# Patient Record
Sex: Male | Born: 1940 | Race: White | Hispanic: No | Marital: Married | Smoking: Never smoker
Health system: Southern US, Community
[De-identification: ages and names within clinical notes are randomized; demographics above are authoritative.]

## PROBLEM LIST (undated history)

## (undated) DIAGNOSIS — M109 Gout, unspecified: Secondary | ICD-10-CM

## (undated) DIAGNOSIS — E785 Hyperlipidemia, unspecified: Secondary | ICD-10-CM

## (undated) DIAGNOSIS — I10 Essential (primary) hypertension: Secondary | ICD-10-CM

## (undated) HISTORY — DX: Gout, unspecified: M10.9

## (undated) HISTORY — DX: Essential (primary) hypertension: I10

## (undated) HISTORY — PX: EYE SURGERY: SHX253

## (undated) HISTORY — DX: Hyperlipidemia, unspecified: E78.5

---

## 1999-08-20 ENCOUNTER — Ambulatory Visit (HOSPITAL_COMMUNITY): Admission: RE | Admit: 1999-08-20 | Discharge: 1999-08-20 | Payer: Self-pay | Admitting: Family Medicine

## 1999-08-20 ENCOUNTER — Encounter: Payer: Self-pay | Admitting: Family Medicine

## 2000-12-19 ENCOUNTER — Encounter: Admission: RE | Admit: 2000-12-19 | Discharge: 2000-12-19 | Payer: Self-pay | Admitting: Orthopedic Surgery

## 2000-12-19 ENCOUNTER — Encounter: Payer: Self-pay | Admitting: Orthopedic Surgery

## 2004-08-01 ENCOUNTER — Encounter (INDEPENDENT_AMBULATORY_CARE_PROVIDER_SITE_OTHER): Payer: Self-pay | Admitting: *Deleted

## 2004-08-01 ENCOUNTER — Ambulatory Visit (HOSPITAL_COMMUNITY): Admission: RE | Admit: 2004-08-01 | Discharge: 2004-08-01 | Payer: Self-pay | Admitting: Gastroenterology

## 2005-08-06 ENCOUNTER — Ambulatory Visit (HOSPITAL_COMMUNITY): Admission: RE | Admit: 2005-08-06 | Discharge: 2005-08-06 | Payer: Self-pay | Admitting: *Deleted

## 2006-02-05 IMAGING — US US ABDOMEN COMPLETE
1 series · 13 of 25 positions shown · non-contrast
Comparison: none

CLINICAL DATA: 64-year-old male, with abnormal LFTs. 
ABDOMEN ULTRASOUND:
TECHNIQUE: Complete abdominal ultrasound examination was performed including evaluation of the liver, gallbladder, bile ducts, pancreas, kidneys, spleen, IVC, and abdominal aorta.

[Series 1: unknown · 0.40mm/px · 13 of 86 slices shown]
[im 1/86]
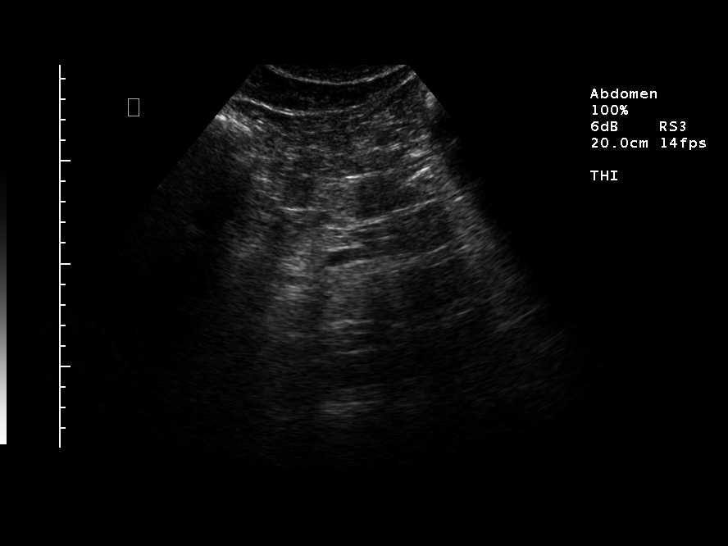
[im 8/86]
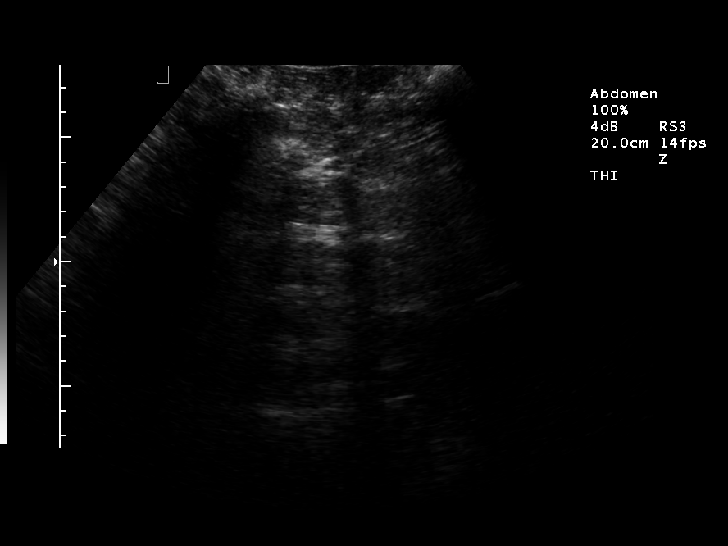
[im 15/86]
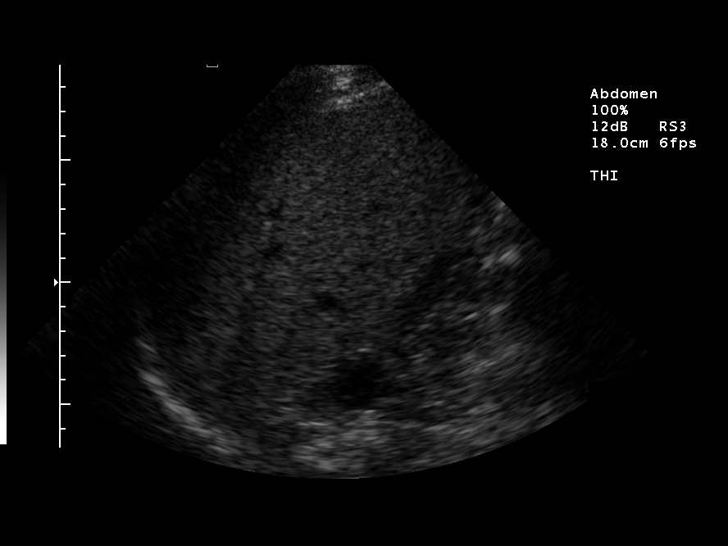
[im 22/86]
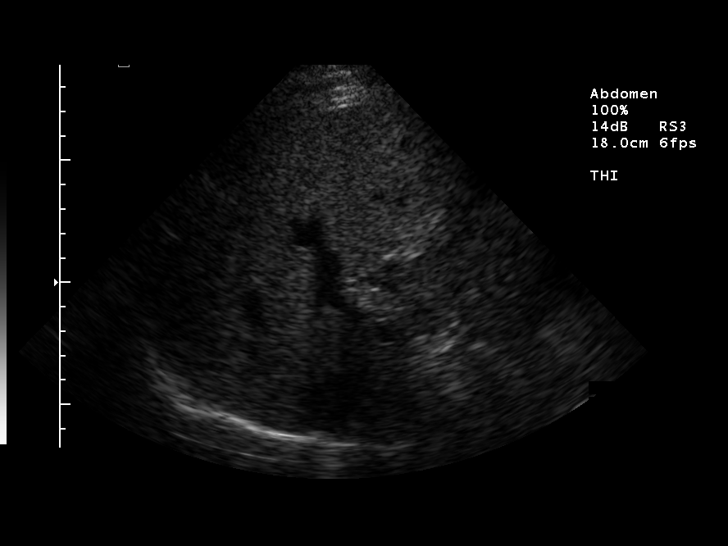
[im 29/86]
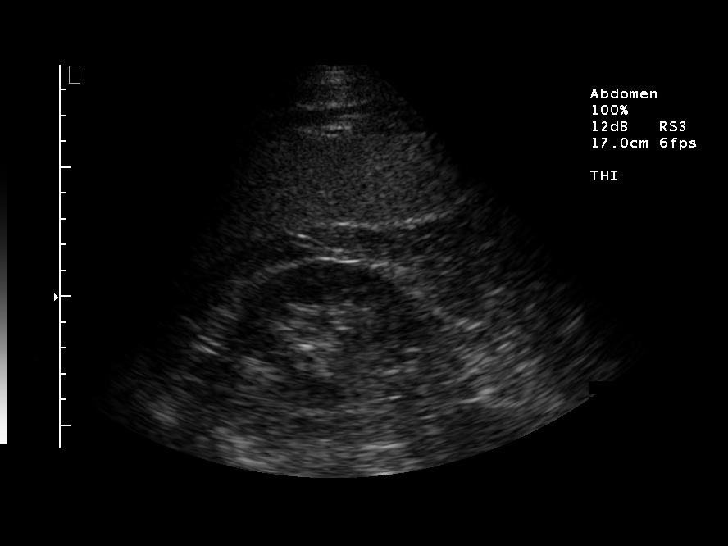
[im 36/86]
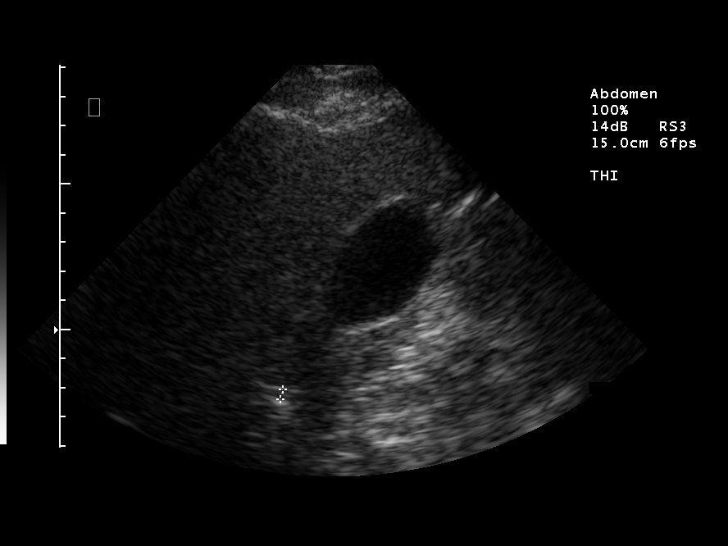
[im 43/86]
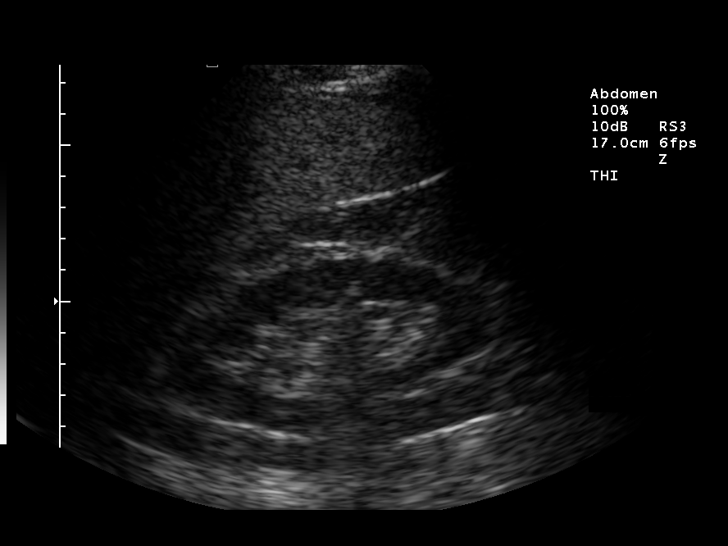
[im 50/86]
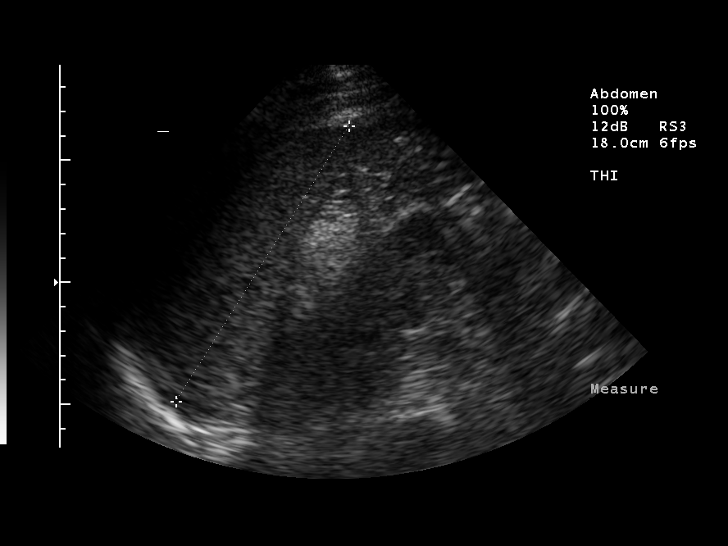
[im 57/86]
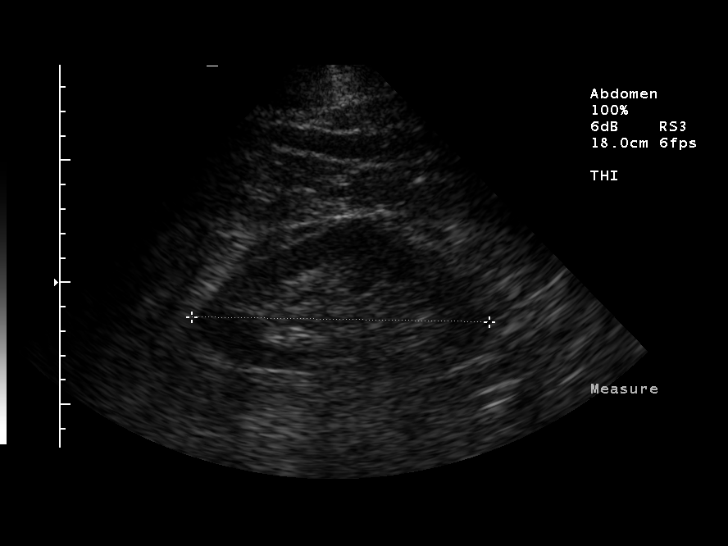
[im 64/86]
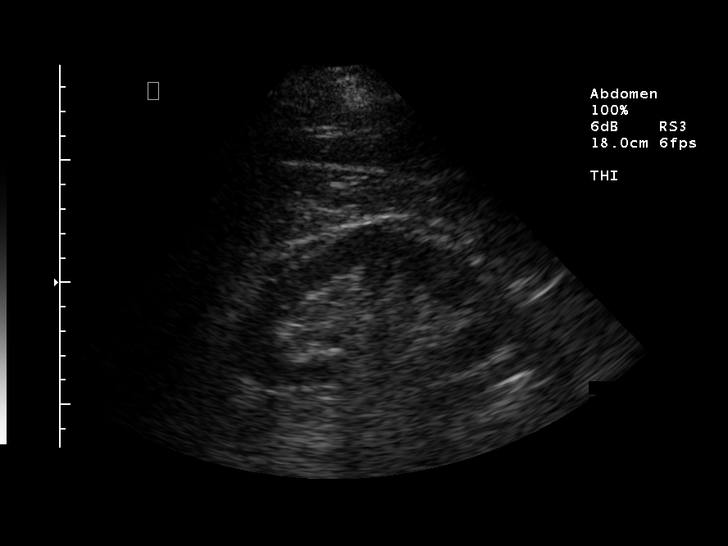
[im 71/86]
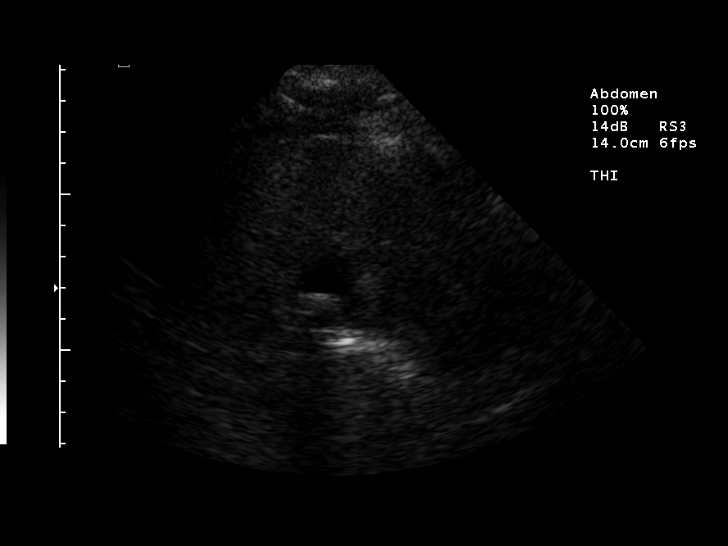
[im 78/86]
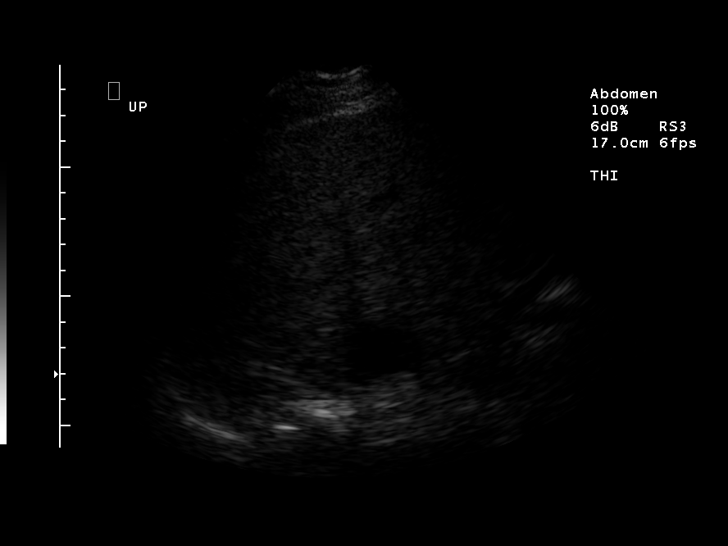
[im 86/86]
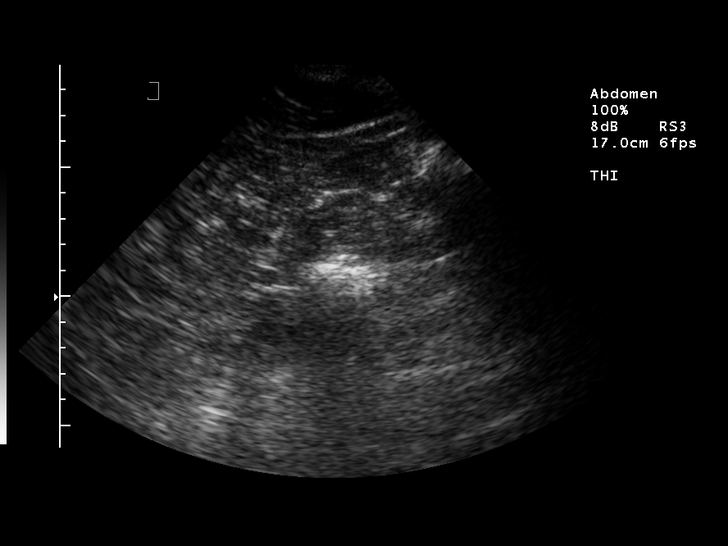

[13 of 25 positions shown; findings below may reference images not displayed]

FINDINGS: The gallbladder demonstrates multiple echogenic shadowing foci within the lumen consistent with cholelithiasis.  No gallbladder wall thickness measuring 2.8 mm.  No sonographic Murphy?s sign elicited during the exam.  The common bile duct is normal measuring 3.5 mm.  The liver demonstrates increased echogenicity consistent with fatty infiltration and mild enlargement with a length of 16.1 cm in craniocaudal dimension.  The left hepatic lobe is difficult to visualize because of bowel gas.  The IVC was not well seen.  Imaging of the pancreas was also limited because of bowel gas.  The spleen measures 13.3 cm in length.  The right kidney measures 11 cm and has an upper pole cyst measuring 2.4 x 2.0 x 3.0 cm.  Left kidney measures 12.2 cm.  Aorta was obscured by bowel gas as well.  No abdominal ascites.
IMPRESSION: 1.  Cholelithiasis without evidence of acute cholecystitis by ultrasound.  No biliary dilatation. 
2.  Diffuse fatty infiltration of the liver with mild hepatomegaly. 
3.  Limited exam with difficulty visualizing the IVC, pancreas, and aorta. 
4.  Right renal upper pole 2.4 cm x 3 cm cyst.

## 2007-08-29 ENCOUNTER — Emergency Department (HOSPITAL_COMMUNITY): Admission: EM | Admit: 2007-08-29 | Discharge: 2007-08-30 | Payer: Self-pay | Admitting: Emergency Medicine

## 2011-04-19 NOTE — Op Note (Signed)
Francisco Wells, Francisco Wells                      ACCOUNT NO.:  [id number]   MEDICAL RECORD NO.:  [id number]                   PATIENT TYPE:  AMB   LOCATION:  ENDO                                 FACILITY:  [facility]   PHYSICIAN:  Graylin Shiver, M.D.                DATE OF BIRTH:  Nov 10, 1941   DATE OF PROCEDURE:  08/01/2004  DATE OF DISCHARGE:                                 OPERATIVE REPORT   PROCEDURE:  Colonoscopy with biopsy.   ENDOSCOPIST:  Graylin Shiver, M.D.   INDICATIONS FOR PROCEDURE:  Screening.   Informed consent was obtained after explanation of the risks of bleeding,  infection and perforation.   PREMEDICATION:  Fentanyl 65 mcg IV and Versed 6.5 mg IV.   DESCRIPTION OF PROCEDURE:  With the patient in the left lateral decubitus  position a rectal exam was performed.  No masses were felt.  The Olympus  colonoscope was inserted into the rectum and advanced around the colon to  the cecum.  The cecal landmarks were identified.  The cecum and ascending  colon were normal. In the transverse colon there were four small polyps that  were biopsied off.  These were scattered along the length of the transverse  colon.  The descending colon looked normal. In the sigmoid colon there was a  small polyp biopsied off.  The rectum looked normal.  He tolerated the  procedure well without complications.   IMPRESSION:  Colon polyps (diagnosis code 211.3).   PLAN:  The pathology will be checked.                                               Graylin Shiver, M.D.    Germain Osgood  D:  08/01/2004  T:  08/01/2004  Job:  [id number]   cc:   Meredith Staggers, M.D.  [address], [address]  [place]  [place] [address]  Fax: [phone]

## 2012-01-14 DIAGNOSIS — C6932 Malignant neoplasm of left choroid: Secondary | ICD-10-CM | POA: Insufficient documentation

## 2014-05-26 ENCOUNTER — Encounter: Payer: Self-pay | Admitting: Podiatry

## 2014-05-26 ENCOUNTER — Ambulatory Visit (INDEPENDENT_AMBULATORY_CARE_PROVIDER_SITE_OTHER): Payer: Commercial Managed Care - HMO | Admitting: Podiatry

## 2014-05-26 ENCOUNTER — Ambulatory Visit (INDEPENDENT_AMBULATORY_CARE_PROVIDER_SITE_OTHER): Payer: Commercial Managed Care - HMO

## 2014-05-26 VITALS — BP 169/95 | HR 68 | Resp 16 | Ht 69.0 in | Wt 210.0 lb

## 2014-05-26 DIAGNOSIS — M779 Enthesopathy, unspecified: Secondary | ICD-10-CM

## 2014-05-26 DIAGNOSIS — M2010 Hallux valgus (acquired), unspecified foot: Secondary | ICD-10-CM

## 2014-05-26 DIAGNOSIS — M109 Gout, unspecified: Secondary | ICD-10-CM

## 2014-05-26 MED ORDER — TRIAMCINOLONE ACETONIDE 10 MG/ML IJ SUSP
10.0000 mg | Freq: Once | INTRAMUSCULAR | Status: AC
  Administered 2014-05-26: 10 mg

## 2014-05-26 NOTE — Patient Instructions (Signed)

## 2014-05-26 NOTE — Progress Notes (Signed)
   Subjective:    Patient ID: Francisco Wells, male    DOB: 1941-06-27, 73 y.o.   MRN: [id number]  HPI Comments: "They tell me its a bunion"  Patient c/o tender 1st MPJ right for several years. The area red and swollen. He gets gout flares periodically. Today, the area is not too painful. No treatment unless he has a flare.  Foot Pain      Review of Systems  All other systems reviewed and are negative.      Objective:   Physical Exam        Assessment & Plan:

## 2014-05-26 NOTE — Progress Notes (Signed)
Subjective:     Patient ID: Francisco Wells, male   DOB: 04-15-1941, 73 y.o.   MRN: [id number]  Foot Pain   patient states that he gets gout flareups approximately 2-3 times a year and that this one has been very painful in his right big toe joint. He states it moves back and forth but that it seems his attacks are less than they had been previously   Review of Systems  All other systems reviewed and are negative.      Objective:   Physical Exam  Nursing note and vitals reviewed. Constitutional: He is oriented to person, place, and time.  Cardiovascular: Intact distal pulses.   Musculoskeletal: Normal range of motion.  Neurological: He is oriented to person, place, and time.  Skin: Skin is warm.   neurovascular status intact both feet and range of motion subtalar midtarsal joint adequate. Muscle strength within normal limits and I noted there to be a prominence first metatarsal head right over left with quite a bit or redness around the first MPJ right and mild swelling around the joint surface noted. 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 We discussed gout and I gave him an instruction sheet on foods to avoid and today I did inject around the capsular and joint 3 mg Kenalog 5 mg I can Marcaine mixture and advised that if symptoms were to get worse I want to see him back and we will consider medicine to try to prevent him from having gout attacks

## 2014-06-10 ENCOUNTER — Ambulatory Visit (INDEPENDENT_AMBULATORY_CARE_PROVIDER_SITE_OTHER): Payer: Commercial Managed Care - HMO

## 2014-06-10 VITALS — BP 157/99 | HR 66 | Resp 18

## 2014-06-10 DIAGNOSIS — M2022 Hallux rigidus, left foot: Secondary | ICD-10-CM

## 2014-06-10 DIAGNOSIS — M19079 Primary osteoarthritis, unspecified ankle and foot: Secondary | ICD-10-CM

## 2014-06-10 DIAGNOSIS — M779 Enthesopathy, unspecified: Secondary | ICD-10-CM

## 2014-06-10 DIAGNOSIS — M19072 Primary osteoarthritis, left ankle and foot: Secondary | ICD-10-CM

## 2014-06-10 DIAGNOSIS — M109 Gout, unspecified: Secondary | ICD-10-CM

## 2014-06-10 DIAGNOSIS — M2020 Hallux rigidus, unspecified foot: Secondary | ICD-10-CM

## 2014-06-10 MED ORDER — COLCHICINE 0.6 MG PO TABS: ORAL_TABLET | ORAL | Status: AC

## 2014-06-10 NOTE — Patient Instructions (Signed)
Information for patients with Gout  Gout defined-Gout occurs when urate crystals accumulate in your joint causing the inflammation and intense pain of gout attack.  Urate crystals can form when you have high levels of uric acid in your blood.  Your body produces uric acid when it breaks down prurines-substances that are found naturally in your body, as well as in certain foods such as organ meats, anchioves, herring, asparagus, and mushrooms.  Normally uric acid dissolves in your blood and passes through your kidneys into your urine.  But sometimes your body either produces too much uric acid or your kidneys excrete too little uric acid.  When this happens, uric acid can build up, forming sharp needle-like urate crystals in a joint or surrounding tissue that cause pain, inflammation and swelling.    Gout is characterized by sudden, severe attacks of pain, redness and tenderness in joints, often the joint at the base of the big toe.  Gout is complex form of arthritis that can affect anyone.  Men are more likely to get gout but women become increasingly more susceptible to gout after menopause.  An acute attack of gout can wake you up in the middle of the night with the sensation that your big toe is on fire.  The affected joint is hot, swollen and so tender that even the weight or the sheet on it may seem intolerable.  If you experience symptoms of an acute gout attack it is important to your doctor as soon as the symptoms start.  Gout that goes untreated can lead to worsening pain and joint damage.  Risk Factors:  You are more likely to develop gout if you have high levels of uric acid in your body.    Factors that increase the uric acid level in your body include:  Lifestyle factors.  Excessive alcohol use-generally more than two drinks a day for men and more than one for women increase the risk of gout.  Medical conditions.  Certain conditions make it more likely that you will develop gout.   These include hypertension, and chronic conditions such as diabetes, high levels of fat and cholesterol in the blood, and narrowing of the arteries.  Certain medications.  The uses of Thiazide diuretics- commonly used to treat hypertension and low dose aspirin can also increase uric acid levels.  Family history of gout.  If other members of your family have had gout, you are more likely to develop the disease.  Age and sex. Gout occurs more often in men than it does in women, primarily because women tend to have lower uric acid levels than men do.  Men are more likely to develop gout earlier usually between the ages of 40-50- whereas women generally develop signs and symptoms after menopause.    Tests and diagnosis:  Tests to help diagnose gout may include:  Blood test.  Your doctor may recommend a blood test to measure the uric acid level in your blood .  Blood tests can be misleading, though.  Some people have high uric acid levels but never experience gout.  And some people have signs and symptoms of gout, but don't have unusual levels of uric acid in their blood.  Joint fluid test.  Your doctor may use a needle to draw fluid from your affected joint.  When examined under the microscope, your joint fluid may reveal urate crystals.  Treatment:  Treatment for gout usually involves medications.  What medications you and your doctor choose will be   based on your current health and other medications you currently take.  Gout medications can be used to treat acute gout attacks and prevent future attacks as well as reduce your risk of complications from gout such as the development of tophi from urate crystal deposits.  Alternative medicine:   Certain foods have been studied for their potential to lower uric acid levels, including:  Coffee.  Studies have found an association between coffee drinking (regular and decaf) and lower uric acid levels.  The evidence is not enough to encourage  non-coffee drinkers to start, but it may give clues to new ways of treating gout in the future.  Vitamin C.  Supplements containing vitamin C may reduce the levels of uric acid in your blood.  However, vitamin as a treatment for gout. Don't assume that if a little vitamin C is good, than lots is better.  Megadoses of vitamin C may increase your bodies uric acid levels.  Cherries.  Cherries have been associated with lower levels of uric acid in studies, but it isn't clear if they have any effect on gout signs and symptoms.  Eating more cherries and other dar-colored fruits, such as blackberries, blueberries, purple grapes and raspberries, may be a safe way to support your gout treatment.    Lifestyle/Diet Recommendations:   Drink 8 to 16 cups ( about 2 to 4 liters) of fluid each day, with at least half being water.  Avoid alcohol  Eat a moderate amount of protein, preferably from healthy sources, such as low-fat or fat-free dairy, tofu, eggs, and nut butters.  Limit you daily intake of meat, fish, and poultry to 4 to 6 ounces.  Avoid high fat meats and desserts.  Decrease you intake of shellfish, beef, lamb, pork, eggs and cheese.  Choose a good source of vitamin C daily such as citrus fruits, strawberries, broccoli,  brussel sprouts, papaya, and cantaloupe.   Choose a good source of vitamin A every other day such as yellow fruits, or dark green/yellow vegetables.  Avoid drastic weigh reduction or fasting.  If weigh loss is desired lose it over a period of several months.  See "dietary considerations.." chart for specific food recommendations.  Dietary Considerations for people with Gout  Food with negligible purine content (0-15 mg of purine nitrogen per 100 grams food)  May use as desired except on calorie variations  Non fat milk Cocoa Cereals (except in list II) Hard candies  Buttermilk Carbonated drinks Vegetables (except in list II) Sherbet  Coffee Fruits Sugar Honey  Tea  Cottage Cheese Gelatin-jell-o Salt  Fruit juice Breads Angel food Cake   Herbs/spices Jams/Jellies Carnation Instant Breakfast    Foods that do not contain excessive purine content, but must be limited due to fat content  Cream Eggs Oil and Salad Dressing  Half and Half Peanut Butter Chocolate  Whole Milk Cakes Potato Chips  Butter Ice Cream Fried Foods  Cheese Nuts Waffles, pancakes   List II: Food with moderate purine content (50-150 mg of purine nitrogen per 100 grams of food)  Limit total amount each day to 5 oz. cooked Lean meat, other than those on list III   Poultry, other than those on list III Fish, other than those on list III   Seafood, other than those on list III  These foods may be used occasionally  Peas Lentils Bran  Spinach Oatmeal Dried Beans and Peas  Asparagus Wheat Germ Mushrooms   Additional information about meat choices  Choose fish   and poultry, particularly without skin, often.  Select lean, well trimmed cuts of meat.  Avoid all fatty meats, bacon , sausage, fried meats, fried fish, or poultry, luncheon meats, cold cuts, hot dogs, meats canned or frozen in gravy, spareribs and frozen and packaged prepared meats.   List III: Foods with HIGH purine content / Foods to AVOID (150-800 mg of purine nitrogen per 100 grams of food)  Anchovies Herring Meat Broths  Liver Mackerel Meat Extracts  Kidney Scallops Meat Drippings  Sardines Wild Game Mincemeat  Sweetbreads Goose Gravy  Heart Tongue Yeast, baker's and brewers   Commercial soups made with any of the foods listed in List II or List III  In addition avoid all alcoholic beverages    Recommendations at this time for the most recent flare up of gout is to start using the colchicine or cold Gerald Stabs as instructed if no improvement within 3-4 days followup with Dr. Felisa Bonier or Dr. Blenda Mounts. Followup with her primary physician regarding possible medication for prevention of gout such as allopurinol, or Uloric.  These medicines can help reduce and maintain a reduced uric acid level. Have your primary physician check your uric acid levels.  Also to help with pain and treatment of the acute attack can take ibuprofen 200 mg tablets, 2 tablets 3 times daily as needed for pain

## 2014-06-10 NOTE — Progress Notes (Signed)
   Subjective:    Patient ID: Francisco Wells, male    DOB: 08-May-1941, 73 y.o.   MRN: [id number]  HPI  I WAS OUT TO SEE MY DAUGHTER AND Tuesday IT STARTED IN MY CALF AND I COULD NOT SLEEP AND PAINFUL AND NOW IT IS IN MY FOOT AND DR REGAL THOUGHT IT COULD BE GOUT AND WANTED TO SEE HIM AND HAVE TREATED ME FOR GOUT    Review of Systems no new systemic changes or findings     Objective:   Physical Exam 73 year old white male well-developed well-nourished right history presents at this time with a complaint of recurrence of possible gout attack. Initiates that it started in his knee he points to just inferior the knee and patellar area on his left knee he underwent to see Dr. Lorre Nick 73 orthopedist early next week. However in the knee area was not swollen and red or inflamed this most likely is just some arthropathy of his knee. Our patient has had swelling redness and inflammation of the first MTP joint left on palpation is warm to touch and is reduced range of motion and tenderness on the range of motion of the left great toe joint.  Neurovascular status is intact with pedal pulses palpable DP +2/4 bilateral PT one over 4 bilateral no pain on Compression no signs of DVT on the left side. Or refill time 3 seconds all digits neurologically epicritic and proprioceptive sensations intact and symmetric bilateral there is normal plantar response DTRs not listed orthopedic biomechanical exam reveals rigid hallux joint left with minimal range of motion and pain on any attempted range of motion however is also red swollen and inflamed consistent with the gouty arthropathy. Cannot rule it out hallux rigidus with posture arthropathy as well. Dermatologically unremarkable other than erythema first MTP joint no open wounds or ulcerations noted       Assessment & Plan:  Assessment suspect acute gout attack left first MTP joint patient is a previous steroid injection providing temporary relief in the past is  treated with colchicine however does not have any current medication to at this time new prescription for culture senior Jeanett Schlein is given to take for twice a day for the first 3 days and once days needed for pain. Also he will followup with his primary physician for evaluation of his gout and uric acid levels and may be candidate Uloric occur allopurinol treatment. Patient will followup with in one to 2 weeks there is no resolution of his symptoms with Lortab your foot first acute of his appointment with Dr. Lorre Nick for his knee and follow up on uric acid levels with his primary physician. Also may recommendations for ibuprofen 4 mg 3 times a day as needed for pain  Harriet Masson DPM

## 2014-07-13 DIAGNOSIS — Z9001 Acquired absence of eye: Secondary | ICD-10-CM | POA: Insufficient documentation

## 2014-07-13 DIAGNOSIS — Q111 Other anophthalmos: Secondary | ICD-10-CM | POA: Insufficient documentation

## 2014-09-01 ENCOUNTER — Ambulatory Visit (INDEPENDENT_AMBULATORY_CARE_PROVIDER_SITE_OTHER): Payer: Commercial Managed Care - HMO | Admitting: Podiatry

## 2014-09-01 ENCOUNTER — Ambulatory Visit (INDEPENDENT_AMBULATORY_CARE_PROVIDER_SITE_OTHER): Payer: Commercial Managed Care - HMO

## 2014-09-01 ENCOUNTER — Encounter: Payer: Self-pay | Admitting: Podiatry

## 2014-09-01 VITALS — BP 140/84 | HR 74 | Resp 15 | Ht 70.0 in | Wt 207.0 lb

## 2014-09-01 DIAGNOSIS — M10072 Idiopathic gout, left ankle and foot: Secondary | ICD-10-CM

## 2014-09-01 DIAGNOSIS — M21612 Bunion of left foot: Secondary | ICD-10-CM

## 2014-09-01 DIAGNOSIS — R609 Edema, unspecified: Secondary | ICD-10-CM

## 2014-09-01 DIAGNOSIS — M109 Gout, unspecified: Secondary | ICD-10-CM | POA: Insufficient documentation

## 2014-09-01 DIAGNOSIS — M2012 Hallux valgus (acquired), left foot: Secondary | ICD-10-CM

## 2014-09-01 MED ORDER — ALLOPURINOL 100 MG PO TABS: 100.0000 mg | ORAL_TABLET | Freq: Every day | ORAL | Status: AC

## 2014-09-01 MED ORDER — TRIAMCINOLONE ACETONIDE 10 MG/ML IJ SUSP
10.0000 mg | Freq: Once | INTRAMUSCULAR | Status: AC
  Administered 2014-09-01: 10 mg

## 2014-09-01 MED ORDER — METHYLPREDNISOLONE 4 MG PO KIT: PACK | ORAL | Status: AC

## 2014-09-01 NOTE — Progress Notes (Signed)
   Subjective:    Patient ID: Francisco Wells, male    DOB: 10/24/1941, 73 y.o.   MRN: [id number]  HPI Comments: Pt states this episode of redness, swelling and pain of the left 1st MPJ and forefoot began about 1 week ago.  Pt states he has had episodes between right and left feet for 10 years and is currently taking Colchicine.  Foot Pain      Review of Systems  All other systems reviewed and are negative.      Objective:   Physical Exam        Assessment & Plan:

## 2014-09-01 NOTE — Progress Notes (Signed)
Subjective:     Patient ID: Francisco Wells, male   DOB: Feb 17, 1941, 73 y.o.   MRN: [id number]  HPI patient points to left first MPJ stating I think this is gout and it has been really sore for about a week and I been getting about 5 or 6 attacks a year for the last couple years. Patient of Dr. 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 Today I injected around the first MPJ 3 mg Kenalog 5 mg Xylocaine and placed on for milligram Medrol Dosepak. I then placed him on temporary allopurinol to try to help prevent these attacks and we will make an appointment with Dr. Ouida Sills for better evaluation of these multiple gout attacks he is experiencing

## 2015-01-20 DIAGNOSIS — L84 Corns and callosities: Secondary | ICD-10-CM | POA: Diagnosis not present

## 2015-01-20 DIAGNOSIS — L853 Xerosis cutis: Secondary | ICD-10-CM | POA: Diagnosis not present

## 2015-01-20 DIAGNOSIS — D224 Melanocytic nevi of scalp and neck: Secondary | ICD-10-CM | POA: Diagnosis not present

## 2015-01-20 DIAGNOSIS — L821 Other seborrheic keratosis: Secondary | ICD-10-CM | POA: Diagnosis not present

## 2015-02-14 DIAGNOSIS — M1A09X1 Idiopathic chronic gout, multiple sites, with tophus (tophi): Secondary | ICD-10-CM | POA: Diagnosis not present

## 2015-03-16 DIAGNOSIS — M1A09X1 Idiopathic chronic gout, multiple sites, with tophus (tophi): Secondary | ICD-10-CM | POA: Diagnosis not present

## 2015-04-10 DIAGNOSIS — Z4422 Encounter for fitting and adjustment of artificial left eye: Secondary | ICD-10-CM | POA: Diagnosis not present

## 2015-04-24 DIAGNOSIS — Z79899 Other long term (current) drug therapy: Secondary | ICD-10-CM | POA: Diagnosis not present

## 2015-04-24 DIAGNOSIS — M1A09X1 Idiopathic chronic gout, multiple sites, with tophus (tophi): Secondary | ICD-10-CM | POA: Diagnosis not present

## 2015-06-01 DIAGNOSIS — N400 Enlarged prostate without lower urinary tract symptoms: Secondary | ICD-10-CM | POA: Diagnosis not present

## 2015-06-01 DIAGNOSIS — K219 Gastro-esophageal reflux disease without esophagitis: Secondary | ICD-10-CM | POA: Diagnosis not present

## 2015-06-01 DIAGNOSIS — N183 Chronic kidney disease, stage 3 (moderate): Secondary | ICD-10-CM | POA: Diagnosis not present

## 2015-06-01 DIAGNOSIS — Z0000 Encounter for general adult medical examination without abnormal findings: Secondary | ICD-10-CM | POA: Diagnosis not present

## 2015-06-01 DIAGNOSIS — I10 Essential (primary) hypertension: Secondary | ICD-10-CM | POA: Diagnosis not present

## 2015-06-01 DIAGNOSIS — R7309 Other abnormal glucose: Secondary | ICD-10-CM | POA: Diagnosis not present

## 2015-06-01 DIAGNOSIS — N529 Male erectile dysfunction, unspecified: Secondary | ICD-10-CM | POA: Diagnosis not present

## 2015-06-01 DIAGNOSIS — E782 Mixed hyperlipidemia: Secondary | ICD-10-CM | POA: Diagnosis not present

## 2015-07-18 DIAGNOSIS — I10 Essential (primary) hypertension: Secondary | ICD-10-CM | POA: Diagnosis not present

## 2015-07-18 DIAGNOSIS — Z9001 Acquired absence of eye: Secondary | ICD-10-CM | POA: Diagnosis not present

## 2015-07-18 DIAGNOSIS — H2511 Age-related nuclear cataract, right eye: Secondary | ICD-10-CM | POA: Diagnosis not present

## 2015-07-18 DIAGNOSIS — H10412 Chronic giant papillary conjunctivitis, left eye: Secondary | ICD-10-CM | POA: Diagnosis not present

## 2015-07-18 DIAGNOSIS — Q111 Other anophthalmos: Secondary | ICD-10-CM | POA: Diagnosis not present

## 2015-08-01 ENCOUNTER — Other Ambulatory Visit: Payer: Self-pay | Admitting: Gastroenterology

## 2015-08-01 DIAGNOSIS — Z86010 Personal history of colonic polyps: Secondary | ICD-10-CM | POA: Diagnosis not present

## 2015-08-01 DIAGNOSIS — Z09 Encounter for follow-up examination after completed treatment for conditions other than malignant neoplasm: Secondary | ICD-10-CM | POA: Diagnosis not present

## 2015-08-01 DIAGNOSIS — D122 Benign neoplasm of ascending colon: Secondary | ICD-10-CM | POA: Diagnosis not present

## 2015-08-01 DIAGNOSIS — D126 Benign neoplasm of colon, unspecified: Secondary | ICD-10-CM | POA: Diagnosis not present

## 2015-08-11 DIAGNOSIS — Z79899 Other long term (current) drug therapy: Secondary | ICD-10-CM | POA: Diagnosis not present

## 2015-08-11 DIAGNOSIS — M1A09X1 Idiopathic chronic gout, multiple sites, with tophus (tophi): Secondary | ICD-10-CM | POA: Diagnosis not present

## 2015-12-01 DIAGNOSIS — N183 Chronic kidney disease, stage 3 (moderate): Secondary | ICD-10-CM | POA: Diagnosis not present

## 2015-12-01 DIAGNOSIS — M109 Gout, unspecified: Secondary | ICD-10-CM | POA: Diagnosis not present

## 2015-12-01 DIAGNOSIS — N529 Male erectile dysfunction, unspecified: Secondary | ICD-10-CM | POA: Diagnosis not present

## 2015-12-01 DIAGNOSIS — E782 Mixed hyperlipidemia: Secondary | ICD-10-CM | POA: Diagnosis not present

## 2015-12-01 DIAGNOSIS — R7309 Other abnormal glucose: Secondary | ICD-10-CM | POA: Diagnosis not present

## 2015-12-01 DIAGNOSIS — K219 Gastro-esophageal reflux disease without esophagitis: Secondary | ICD-10-CM | POA: Diagnosis not present

## 2015-12-01 DIAGNOSIS — N400 Enlarged prostate without lower urinary tract symptoms: Secondary | ICD-10-CM | POA: Diagnosis not present

## 2015-12-01 DIAGNOSIS — I10 Essential (primary) hypertension: Secondary | ICD-10-CM | POA: Diagnosis not present

## 2016-02-08 DIAGNOSIS — M1A09X1 Idiopathic chronic gout, multiple sites, with tophus (tophi): Secondary | ICD-10-CM | POA: Diagnosis not present

## 2016-02-08 DIAGNOSIS — Z79899 Other long term (current) drug therapy: Secondary | ICD-10-CM | POA: Diagnosis not present

## 2016-05-01 DIAGNOSIS — Z4422 Encounter for fitting and adjustment of artificial left eye: Secondary | ICD-10-CM | POA: Diagnosis not present

## 2016-06-24 DIAGNOSIS — E782 Mixed hyperlipidemia: Secondary | ICD-10-CM | POA: Diagnosis not present

## 2016-06-24 DIAGNOSIS — M109 Gout, unspecified: Secondary | ICD-10-CM | POA: Diagnosis not present

## 2016-06-24 DIAGNOSIS — Z125 Encounter for screening for malignant neoplasm of prostate: Secondary | ICD-10-CM | POA: Diagnosis not present

## 2016-06-24 DIAGNOSIS — N183 Chronic kidney disease, stage 3 (moderate): Secondary | ICD-10-CM | POA: Diagnosis not present

## 2016-06-24 DIAGNOSIS — Z0000 Encounter for general adult medical examination without abnormal findings: Secondary | ICD-10-CM | POA: Diagnosis not present

## 2016-06-24 DIAGNOSIS — R7301 Impaired fasting glucose: Secondary | ICD-10-CM | POA: Diagnosis not present

## 2016-06-24 DIAGNOSIS — I10 Essential (primary) hypertension: Secondary | ICD-10-CM | POA: Diagnosis not present

## 2016-06-24 DIAGNOSIS — N400 Enlarged prostate without lower urinary tract symptoms: Secondary | ICD-10-CM | POA: Diagnosis not present

## 2016-06-24 DIAGNOSIS — N529 Male erectile dysfunction, unspecified: Secondary | ICD-10-CM | POA: Diagnosis not present

## 2016-06-24 DIAGNOSIS — K219 Gastro-esophageal reflux disease without esophagitis: Secondary | ICD-10-CM | POA: Diagnosis not present

## 2016-07-16 DIAGNOSIS — H9113 Presbycusis, bilateral: Secondary | ICD-10-CM | POA: Diagnosis not present

## 2016-07-16 DIAGNOSIS — H903 Sensorineural hearing loss, bilateral: Secondary | ICD-10-CM | POA: Diagnosis not present

## 2016-07-16 DIAGNOSIS — H9313 Tinnitus, bilateral: Secondary | ICD-10-CM | POA: Diagnosis not present

## 2016-07-18 DIAGNOSIS — H10412 Chronic giant papillary conjunctivitis, left eye: Secondary | ICD-10-CM | POA: Diagnosis not present

## 2016-07-18 DIAGNOSIS — C6932 Malignant neoplasm of left choroid: Secondary | ICD-10-CM | POA: Diagnosis not present

## 2016-07-18 DIAGNOSIS — M109 Gout, unspecified: Secondary | ICD-10-CM | POA: Diagnosis not present

## 2016-07-18 DIAGNOSIS — Z9001 Acquired absence of eye: Secondary | ICD-10-CM | POA: Diagnosis not present

## 2016-07-18 DIAGNOSIS — Q111 Other anophthalmos: Secondary | ICD-10-CM | POA: Diagnosis not present

## 2016-07-18 DIAGNOSIS — L980 Pyogenic granuloma: Secondary | ICD-10-CM | POA: Diagnosis not present

## 2016-07-18 DIAGNOSIS — H2511 Age-related nuclear cataract, right eye: Secondary | ICD-10-CM | POA: Diagnosis not present

## 2016-07-18 DIAGNOSIS — Z79899 Other long term (current) drug therapy: Secondary | ICD-10-CM | POA: Diagnosis not present

## 2016-07-18 DIAGNOSIS — Z7982 Long term (current) use of aspirin: Secondary | ICD-10-CM | POA: Diagnosis not present

## 2016-07-18 DIAGNOSIS — I10 Essential (primary) hypertension: Secondary | ICD-10-CM | POA: Diagnosis not present

## 2016-09-02 DIAGNOSIS — H903 Sensorineural hearing loss, bilateral: Secondary | ICD-10-CM | POA: Diagnosis not present

## 2017-02-07 DIAGNOSIS — Z79899 Other long term (current) drug therapy: Secondary | ICD-10-CM | POA: Diagnosis not present

## 2017-02-07 DIAGNOSIS — E669 Obesity, unspecified: Secondary | ICD-10-CM | POA: Diagnosis not present

## 2017-02-07 DIAGNOSIS — M1A09X1 Idiopathic chronic gout, multiple sites, with tophus (tophi): Secondary | ICD-10-CM | POA: Diagnosis not present

## 2017-02-07 DIAGNOSIS — Z6830 Body mass index (BMI) 30.0-30.9, adult: Secondary | ICD-10-CM | POA: Diagnosis not present

## 2017-04-14 DIAGNOSIS — Z0100 Encounter for examination of eyes and vision without abnormal findings: Secondary | ICD-10-CM | POA: Diagnosis not present

## 2017-04-21 DIAGNOSIS — Z4422 Encounter for fitting and adjustment of artificial left eye: Secondary | ICD-10-CM | POA: Diagnosis not present

## 2017-08-15 DIAGNOSIS — I10 Essential (primary) hypertension: Secondary | ICD-10-CM | POA: Diagnosis not present

## 2017-08-15 DIAGNOSIS — N400 Enlarged prostate without lower urinary tract symptoms: Secondary | ICD-10-CM | POA: Diagnosis not present

## 2017-08-15 DIAGNOSIS — Z0000 Encounter for general adult medical examination without abnormal findings: Secondary | ICD-10-CM | POA: Diagnosis not present

## 2017-08-15 DIAGNOSIS — L853 Xerosis cutis: Secondary | ICD-10-CM | POA: Diagnosis not present

## 2017-08-15 DIAGNOSIS — Z23 Encounter for immunization: Secondary | ICD-10-CM | POA: Diagnosis not present

## 2017-08-15 DIAGNOSIS — K219 Gastro-esophageal reflux disease without esophagitis: Secondary | ICD-10-CM | POA: Diagnosis not present

## 2017-08-15 DIAGNOSIS — M109 Gout, unspecified: Secondary | ICD-10-CM | POA: Diagnosis not present

## 2017-08-15 DIAGNOSIS — R7303 Prediabetes: Secondary | ICD-10-CM | POA: Diagnosis not present

## 2017-08-15 DIAGNOSIS — Z1389 Encounter for screening for other disorder: Secondary | ICD-10-CM | POA: Diagnosis not present

## 2017-08-15 DIAGNOSIS — N183 Chronic kidney disease, stage 3 (moderate): Secondary | ICD-10-CM | POA: Diagnosis not present

## 2017-08-15 DIAGNOSIS — M542 Cervicalgia: Secondary | ICD-10-CM | POA: Diagnosis not present

## 2017-08-15 DIAGNOSIS — E782 Mixed hyperlipidemia: Secondary | ICD-10-CM | POA: Diagnosis not present

## 2017-08-29 DIAGNOSIS — H02402 Unspecified ptosis of left eyelid: Secondary | ICD-10-CM | POA: Diagnosis not present

## 2017-08-29 DIAGNOSIS — L908 Other atrophic disorders of skin: Secondary | ICD-10-CM | POA: Diagnosis not present

## 2017-08-29 DIAGNOSIS — H5340 Unspecified visual field defects: Secondary | ICD-10-CM | POA: Diagnosis not present

## 2017-08-29 DIAGNOSIS — Q111 Other anophthalmos: Secondary | ICD-10-CM | POA: Diagnosis not present

## 2017-08-29 DIAGNOSIS — H05402 Unspecified enophthalmos, left eye: Secondary | ICD-10-CM | POA: Diagnosis not present

## 2017-08-29 DIAGNOSIS — H02409 Unspecified ptosis of unspecified eyelid: Secondary | ICD-10-CM | POA: Diagnosis not present

## 2017-10-27 DIAGNOSIS — Z4422 Encounter for fitting and adjustment of artificial left eye: Secondary | ICD-10-CM | POA: Diagnosis not present

## 2018-02-09 DIAGNOSIS — E669 Obesity, unspecified: Secondary | ICD-10-CM | POA: Diagnosis not present

## 2018-02-09 DIAGNOSIS — M1A09X1 Idiopathic chronic gout, multiple sites, with tophus (tophi): Secondary | ICD-10-CM | POA: Diagnosis not present

## 2018-02-09 DIAGNOSIS — M150 Primary generalized (osteo)arthritis: Secondary | ICD-10-CM | POA: Diagnosis not present

## 2018-02-09 DIAGNOSIS — Z6830 Body mass index (BMI) 30.0-30.9, adult: Secondary | ICD-10-CM | POA: Diagnosis not present

## 2018-02-09 DIAGNOSIS — Z79899 Other long term (current) drug therapy: Secondary | ICD-10-CM | POA: Diagnosis not present

## 2018-03-05 DIAGNOSIS — N529 Male erectile dysfunction, unspecified: Secondary | ICD-10-CM | POA: Diagnosis not present

## 2018-03-05 DIAGNOSIS — E782 Mixed hyperlipidemia: Secondary | ICD-10-CM | POA: Diagnosis not present

## 2018-03-05 DIAGNOSIS — K219 Gastro-esophageal reflux disease without esophagitis: Secondary | ICD-10-CM | POA: Diagnosis not present

## 2018-03-05 DIAGNOSIS — I10 Essential (primary) hypertension: Secondary | ICD-10-CM | POA: Diagnosis not present

## 2018-03-05 DIAGNOSIS — R7303 Prediabetes: Secondary | ICD-10-CM | POA: Diagnosis not present

## 2018-03-05 DIAGNOSIS — M109 Gout, unspecified: Secondary | ICD-10-CM | POA: Diagnosis not present

## 2018-08-17 DIAGNOSIS — H02402 Unspecified ptosis of left eyelid: Secondary | ICD-10-CM | POA: Diagnosis not present

## 2018-08-17 DIAGNOSIS — Q111 Other anophthalmos: Secondary | ICD-10-CM | POA: Diagnosis not present

## 2018-08-17 DIAGNOSIS — H02831 Dermatochalasis of right upper eyelid: Secondary | ICD-10-CM | POA: Diagnosis not present

## 2018-08-17 DIAGNOSIS — H02834 Dermatochalasis of left upper eyelid: Secondary | ICD-10-CM | POA: Diagnosis not present

## 2018-08-17 DIAGNOSIS — H57811 Brow ptosis, right: Secondary | ICD-10-CM | POA: Diagnosis not present

## 2018-08-17 DIAGNOSIS — H57813 Brow ptosis, bilateral: Secondary | ICD-10-CM | POA: Diagnosis not present

## 2018-08-20 DIAGNOSIS — G4700 Insomnia, unspecified: Secondary | ICD-10-CM | POA: Diagnosis not present

## 2018-08-20 DIAGNOSIS — Z1211 Encounter for screening for malignant neoplasm of colon: Secondary | ICD-10-CM | POA: Diagnosis not present

## 2018-08-20 DIAGNOSIS — M109 Gout, unspecified: Secondary | ICD-10-CM | POA: Diagnosis not present

## 2018-08-20 DIAGNOSIS — Z23 Encounter for immunization: Secondary | ICD-10-CM | POA: Diagnosis not present

## 2018-08-20 DIAGNOSIS — R7303 Prediabetes: Secondary | ICD-10-CM | POA: Diagnosis not present

## 2018-08-20 DIAGNOSIS — K219 Gastro-esophageal reflux disease without esophagitis: Secondary | ICD-10-CM | POA: Diagnosis not present

## 2018-08-20 DIAGNOSIS — Z1389 Encounter for screening for other disorder: Secondary | ICD-10-CM | POA: Diagnosis not present

## 2018-08-20 DIAGNOSIS — Z0000 Encounter for general adult medical examination without abnormal findings: Secondary | ICD-10-CM | POA: Diagnosis not present

## 2018-08-20 DIAGNOSIS — E782 Mixed hyperlipidemia: Secondary | ICD-10-CM | POA: Diagnosis not present

## 2018-08-20 DIAGNOSIS — N529 Male erectile dysfunction, unspecified: Secondary | ICD-10-CM | POA: Diagnosis not present

## 2018-08-20 DIAGNOSIS — Z125 Encounter for screening for malignant neoplasm of prostate: Secondary | ICD-10-CM | POA: Diagnosis not present

## 2018-08-20 DIAGNOSIS — I10 Essential (primary) hypertension: Secondary | ICD-10-CM | POA: Diagnosis not present

## 2018-10-02 DIAGNOSIS — Z4422 Encounter for fitting and adjustment of artificial left eye: Secondary | ICD-10-CM | POA: Diagnosis not present

## 2018-10-19 DIAGNOSIS — Q111 Other anophthalmos: Secondary | ICD-10-CM | POA: Diagnosis not present

## 2018-10-19 DIAGNOSIS — H57819 Brow ptosis, unspecified: Secondary | ICD-10-CM | POA: Diagnosis not present

## 2018-10-19 DIAGNOSIS — H02402 Unspecified ptosis of left eyelid: Secondary | ICD-10-CM | POA: Diagnosis not present

## 2018-10-19 DIAGNOSIS — H02834 Dermatochalasis of left upper eyelid: Secondary | ICD-10-CM | POA: Diagnosis not present

## 2018-10-19 DIAGNOSIS — H02831 Dermatochalasis of right upper eyelid: Secondary | ICD-10-CM | POA: Diagnosis not present

## 2019-01-11 DIAGNOSIS — I10 Essential (primary) hypertension: Secondary | ICD-10-CM | POA: Insufficient documentation

## 2019-01-25 DIAGNOSIS — Z79899 Other long term (current) drug therapy: Secondary | ICD-10-CM | POA: Diagnosis not present

## 2019-01-25 DIAGNOSIS — H02834 Dermatochalasis of left upper eyelid: Secondary | ICD-10-CM | POA: Diagnosis not present

## 2019-01-25 DIAGNOSIS — H02831 Dermatochalasis of right upper eyelid: Secondary | ICD-10-CM | POA: Diagnosis not present

## 2019-01-25 DIAGNOSIS — I10 Essential (primary) hypertension: Secondary | ICD-10-CM | POA: Diagnosis not present

## 2019-01-25 DIAGNOSIS — H57813 Brow ptosis, bilateral: Secondary | ICD-10-CM | POA: Diagnosis not present

## 2019-01-25 DIAGNOSIS — H57819 Brow ptosis, unspecified: Secondary | ICD-10-CM | POA: Diagnosis not present

## 2019-01-25 DIAGNOSIS — H02402 Unspecified ptosis of left eyelid: Secondary | ICD-10-CM | POA: Diagnosis not present

## 2019-02-02 DIAGNOSIS — H57819 Brow ptosis, unspecified: Secondary | ICD-10-CM | POA: Diagnosis not present

## 2019-02-02 DIAGNOSIS — H02402 Unspecified ptosis of left eyelid: Secondary | ICD-10-CM | POA: Diagnosis not present

## 2019-02-02 DIAGNOSIS — H02831 Dermatochalasis of right upper eyelid: Secondary | ICD-10-CM | POA: Diagnosis not present

## 2019-02-02 DIAGNOSIS — Z9889 Other specified postprocedural states: Secondary | ICD-10-CM | POA: Diagnosis not present

## 2019-02-02 DIAGNOSIS — H02834 Dermatochalasis of left upper eyelid: Secondary | ICD-10-CM | POA: Diagnosis not present

## 2019-02-10 DIAGNOSIS — M150 Primary generalized (osteo)arthritis: Secondary | ICD-10-CM | POA: Diagnosis not present

## 2019-02-10 DIAGNOSIS — E669 Obesity, unspecified: Secondary | ICD-10-CM | POA: Diagnosis not present

## 2019-02-10 DIAGNOSIS — M1A09X1 Idiopathic chronic gout, multiple sites, with tophus (tophi): Secondary | ICD-10-CM | POA: Diagnosis not present

## 2019-02-10 DIAGNOSIS — Z6830 Body mass index (BMI) 30.0-30.9, adult: Secondary | ICD-10-CM | POA: Diagnosis not present

## 2019-02-10 DIAGNOSIS — Z79899 Other long term (current) drug therapy: Secondary | ICD-10-CM | POA: Diagnosis not present

## 2019-02-19 DIAGNOSIS — I10 Essential (primary) hypertension: Secondary | ICD-10-CM | POA: Diagnosis not present

## 2019-02-19 DIAGNOSIS — N529 Male erectile dysfunction, unspecified: Secondary | ICD-10-CM | POA: Diagnosis not present

## 2019-02-19 DIAGNOSIS — E782 Mixed hyperlipidemia: Secondary | ICD-10-CM | POA: Diagnosis not present

## 2019-02-19 DIAGNOSIS — N183 Chronic kidney disease, stage 3 (moderate): Secondary | ICD-10-CM | POA: Diagnosis not present

## 2019-02-19 DIAGNOSIS — K219 Gastro-esophageal reflux disease without esophagitis: Secondary | ICD-10-CM | POA: Diagnosis not present

## 2019-02-19 DIAGNOSIS — R7303 Prediabetes: Secondary | ICD-10-CM | POA: Diagnosis not present

## 2019-02-19 DIAGNOSIS — M109 Gout, unspecified: Secondary | ICD-10-CM | POA: Diagnosis not present

## 2019-02-19 DIAGNOSIS — N400 Enlarged prostate without lower urinary tract symptoms: Secondary | ICD-10-CM | POA: Diagnosis not present

## 2019-06-08 DIAGNOSIS — Z1159 Encounter for screening for other viral diseases: Secondary | ICD-10-CM | POA: Diagnosis not present

## 2019-06-08 DIAGNOSIS — H6121 Impacted cerumen, right ear: Secondary | ICD-10-CM | POA: Diagnosis not present

## 2019-06-10 DIAGNOSIS — H6122 Impacted cerumen, left ear: Secondary | ICD-10-CM | POA: Diagnosis not present

## 2019-06-10 DIAGNOSIS — H60331 Swimmer's ear, right ear: Secondary | ICD-10-CM | POA: Diagnosis not present

## 2019-06-10 DIAGNOSIS — Z974 Presence of external hearing-aid: Secondary | ICD-10-CM | POA: Diagnosis not present

## 2019-06-10 DIAGNOSIS — H903 Sensorineural hearing loss, bilateral: Secondary | ICD-10-CM | POA: Diagnosis not present

## 2019-06-10 DIAGNOSIS — Z9089 Acquired absence of other organs: Secondary | ICD-10-CM | POA: Diagnosis not present

## 2019-06-11 DIAGNOSIS — Z9889 Other specified postprocedural states: Secondary | ICD-10-CM | POA: Diagnosis not present

## 2019-06-11 DIAGNOSIS — Z48810 Encounter for surgical aftercare following surgery on the sense organs: Secondary | ICD-10-CM | POA: Diagnosis not present

## 2019-06-18 DIAGNOSIS — H7291 Unspecified perforation of tympanic membrane, right ear: Secondary | ICD-10-CM | POA: Diagnosis not present

## 2019-06-18 DIAGNOSIS — H722X1 Other marginal perforations of tympanic membrane, right ear: Secondary | ICD-10-CM | POA: Insufficient documentation

## 2019-06-18 DIAGNOSIS — H60391 Other infective otitis externa, right ear: Secondary | ICD-10-CM | POA: Diagnosis not present

## 2019-06-21 DIAGNOSIS — Z0100 Encounter for examination of eyes and vision without abnormal findings: Secondary | ICD-10-CM | POA: Diagnosis not present

## 2019-06-21 DIAGNOSIS — H524 Presbyopia: Secondary | ICD-10-CM | POA: Diagnosis not present

## 2019-06-22 DIAGNOSIS — I10 Essential (primary) hypertension: Secondary | ICD-10-CM | POA: Diagnosis not present

## 2019-06-22 DIAGNOSIS — E782 Mixed hyperlipidemia: Secondary | ICD-10-CM | POA: Diagnosis not present

## 2019-06-22 DIAGNOSIS — N400 Enlarged prostate without lower urinary tract symptoms: Secondary | ICD-10-CM | POA: Diagnosis not present

## 2019-06-22 DIAGNOSIS — N183 Chronic kidney disease, stage 3 (moderate): Secondary | ICD-10-CM | POA: Diagnosis not present

## 2019-07-08 DIAGNOSIS — H903 Sensorineural hearing loss, bilateral: Secondary | ICD-10-CM | POA: Diagnosis not present

## 2019-07-08 DIAGNOSIS — H60391 Other infective otitis externa, right ear: Secondary | ICD-10-CM | POA: Diagnosis not present

## 2019-07-08 DIAGNOSIS — H7291 Unspecified perforation of tympanic membrane, right ear: Secondary | ICD-10-CM | POA: Diagnosis not present

## 2019-07-23 DIAGNOSIS — H90A31 Mixed conductive and sensorineural hearing loss, unilateral, right ear with restricted hearing on the contralateral side: Secondary | ICD-10-CM | POA: Diagnosis not present

## 2019-09-03 DIAGNOSIS — H903 Sensorineural hearing loss, bilateral: Secondary | ICD-10-CM | POA: Diagnosis not present

## 2019-09-03 DIAGNOSIS — H90A31 Mixed conductive and sensorineural hearing loss, unilateral, right ear with restricted hearing on the contralateral side: Secondary | ICD-10-CM | POA: Diagnosis not present

## 2019-09-03 DIAGNOSIS — H7291 Unspecified perforation of tympanic membrane, right ear: Secondary | ICD-10-CM | POA: Diagnosis not present

## 2019-09-16 DIAGNOSIS — Z0000 Encounter for general adult medical examination without abnormal findings: Secondary | ICD-10-CM | POA: Diagnosis not present

## 2019-09-16 DIAGNOSIS — M109 Gout, unspecified: Secondary | ICD-10-CM | POA: Diagnosis not present

## 2019-09-16 DIAGNOSIS — E782 Mixed hyperlipidemia: Secondary | ICD-10-CM | POA: Diagnosis not present

## 2019-09-16 DIAGNOSIS — R7303 Prediabetes: Secondary | ICD-10-CM | POA: Diagnosis not present

## 2019-09-16 DIAGNOSIS — N529 Male erectile dysfunction, unspecified: Secondary | ICD-10-CM | POA: Diagnosis not present

## 2019-09-16 DIAGNOSIS — Z1389 Encounter for screening for other disorder: Secondary | ICD-10-CM | POA: Diagnosis not present

## 2019-09-16 DIAGNOSIS — Z23 Encounter for immunization: Secondary | ICD-10-CM | POA: Diagnosis not present

## 2019-09-16 DIAGNOSIS — I10 Essential (primary) hypertension: Secondary | ICD-10-CM | POA: Diagnosis not present

## 2019-09-16 DIAGNOSIS — N1831 Chronic kidney disease, stage 3a: Secondary | ICD-10-CM | POA: Diagnosis not present

## 2019-09-28 DIAGNOSIS — Q111 Other anophthalmos: Secondary | ICD-10-CM | POA: Diagnosis not present

## 2019-09-28 DIAGNOSIS — Z9889 Other specified postprocedural states: Secondary | ICD-10-CM | POA: Diagnosis not present

## 2019-09-28 DIAGNOSIS — Z48810 Encounter for surgical aftercare following surgery on the sense organs: Secondary | ICD-10-CM | POA: Diagnosis not present

## 2019-09-28 DIAGNOSIS — H02886 Meibomian gland dysfunction of left eye, unspecified eyelid: Secondary | ICD-10-CM | POA: Diagnosis not present

## 2019-09-28 DIAGNOSIS — H02883 Meibomian gland dysfunction of right eye, unspecified eyelid: Secondary | ICD-10-CM | POA: Diagnosis not present

## 2019-11-16 DIAGNOSIS — N400 Enlarged prostate without lower urinary tract symptoms: Secondary | ICD-10-CM | POA: Diagnosis not present

## 2019-11-16 DIAGNOSIS — I10 Essential (primary) hypertension: Secondary | ICD-10-CM | POA: Diagnosis not present

## 2019-11-16 DIAGNOSIS — E782 Mixed hyperlipidemia: Secondary | ICD-10-CM | POA: Diagnosis not present

## 2019-11-16 DIAGNOSIS — N183 Chronic kidney disease, stage 3 unspecified: Secondary | ICD-10-CM | POA: Diagnosis not present

## 2019-12-15 DIAGNOSIS — I10 Essential (primary) hypertension: Secondary | ICD-10-CM | POA: Diagnosis not present

## 2019-12-15 DIAGNOSIS — N183 Chronic kidney disease, stage 3 unspecified: Secondary | ICD-10-CM | POA: Diagnosis not present

## 2019-12-15 DIAGNOSIS — N400 Enlarged prostate without lower urinary tract symptoms: Secondary | ICD-10-CM | POA: Diagnosis not present

## 2019-12-15 DIAGNOSIS — E782 Mixed hyperlipidemia: Secondary | ICD-10-CM | POA: Diagnosis not present

## 2020-01-25 DIAGNOSIS — N400 Enlarged prostate without lower urinary tract symptoms: Secondary | ICD-10-CM | POA: Diagnosis not present

## 2020-01-25 DIAGNOSIS — I10 Essential (primary) hypertension: Secondary | ICD-10-CM | POA: Diagnosis not present

## 2020-01-25 DIAGNOSIS — N183 Chronic kidney disease, stage 3 unspecified: Secondary | ICD-10-CM | POA: Diagnosis not present

## 2020-01-25 DIAGNOSIS — E782 Mixed hyperlipidemia: Secondary | ICD-10-CM | POA: Diagnosis not present

## 2020-02-10 DIAGNOSIS — I10 Essential (primary) hypertension: Secondary | ICD-10-CM | POA: Diagnosis not present

## 2020-03-17 DIAGNOSIS — R7303 Prediabetes: Secondary | ICD-10-CM | POA: Diagnosis not present

## 2020-03-17 DIAGNOSIS — N1831 Chronic kidney disease, stage 3a: Secondary | ICD-10-CM | POA: Diagnosis not present

## 2020-03-17 DIAGNOSIS — N529 Male erectile dysfunction, unspecified: Secondary | ICD-10-CM | POA: Diagnosis not present

## 2020-03-17 DIAGNOSIS — K219 Gastro-esophageal reflux disease without esophagitis: Secondary | ICD-10-CM | POA: Diagnosis not present

## 2020-03-17 DIAGNOSIS — M109 Gout, unspecified: Secondary | ICD-10-CM | POA: Diagnosis not present

## 2020-03-17 DIAGNOSIS — I10 Essential (primary) hypertension: Secondary | ICD-10-CM | POA: Diagnosis not present

## 2020-03-17 DIAGNOSIS — E782 Mixed hyperlipidemia: Secondary | ICD-10-CM | POA: Diagnosis not present

## 2020-03-31 DIAGNOSIS — H2511 Age-related nuclear cataract, right eye: Secondary | ICD-10-CM | POA: Diagnosis not present

## 2020-03-31 DIAGNOSIS — H31091 Other chorioretinal scars, right eye: Secondary | ICD-10-CM | POA: Diagnosis not present

## 2020-03-31 DIAGNOSIS — H10413 Chronic giant papillary conjunctivitis, bilateral: Secondary | ICD-10-CM | POA: Diagnosis not present

## 2020-05-19 DIAGNOSIS — Z4420 Encounter for fitting and adjustment of artificial eye, unspecified: Secondary | ICD-10-CM | POA: Diagnosis not present

## 2020-06-16 DIAGNOSIS — I10 Essential (primary) hypertension: Secondary | ICD-10-CM | POA: Diagnosis not present

## 2020-06-16 DIAGNOSIS — E782 Mixed hyperlipidemia: Secondary | ICD-10-CM | POA: Diagnosis not present

## 2020-06-16 DIAGNOSIS — N400 Enlarged prostate without lower urinary tract symptoms: Secondary | ICD-10-CM | POA: Diagnosis not present

## 2020-06-16 DIAGNOSIS — N183 Chronic kidney disease, stage 3 unspecified: Secondary | ICD-10-CM | POA: Diagnosis not present

## 2020-06-16 DIAGNOSIS — N1831 Chronic kidney disease, stage 3a: Secondary | ICD-10-CM | POA: Diagnosis not present

## 2020-07-19 DIAGNOSIS — I10 Essential (primary) hypertension: Secondary | ICD-10-CM | POA: Diagnosis not present

## 2020-07-19 DIAGNOSIS — N400 Enlarged prostate without lower urinary tract symptoms: Secondary | ICD-10-CM | POA: Diagnosis not present

## 2020-07-19 DIAGNOSIS — N183 Chronic kidney disease, stage 3 unspecified: Secondary | ICD-10-CM | POA: Diagnosis not present

## 2020-07-19 DIAGNOSIS — E782 Mixed hyperlipidemia: Secondary | ICD-10-CM | POA: Diagnosis not present

## 2020-07-19 DIAGNOSIS — N1831 Chronic kidney disease, stage 3a: Secondary | ICD-10-CM | POA: Diagnosis not present

## 2020-08-15 DIAGNOSIS — Z4422 Encounter for fitting and adjustment of artificial left eye: Secondary | ICD-10-CM | POA: Diagnosis not present

## 2020-08-18 DIAGNOSIS — E782 Mixed hyperlipidemia: Secondary | ICD-10-CM | POA: Diagnosis not present

## 2020-08-18 DIAGNOSIS — N183 Chronic kidney disease, stage 3 unspecified: Secondary | ICD-10-CM | POA: Diagnosis not present

## 2020-08-18 DIAGNOSIS — I10 Essential (primary) hypertension: Secondary | ICD-10-CM | POA: Diagnosis not present

## 2020-08-18 DIAGNOSIS — N1831 Chronic kidney disease, stage 3a: Secondary | ICD-10-CM | POA: Diagnosis not present

## 2020-08-18 DIAGNOSIS — N400 Enlarged prostate without lower urinary tract symptoms: Secondary | ICD-10-CM | POA: Diagnosis not present

## 2020-10-02 DIAGNOSIS — I10 Essential (primary) hypertension: Secondary | ICD-10-CM | POA: Diagnosis not present

## 2020-10-02 DIAGNOSIS — E782 Mixed hyperlipidemia: Secondary | ICD-10-CM | POA: Diagnosis not present

## 2020-10-02 DIAGNOSIS — N529 Male erectile dysfunction, unspecified: Secondary | ICD-10-CM | POA: Diagnosis not present

## 2020-10-02 DIAGNOSIS — R7303 Prediabetes: Secondary | ICD-10-CM | POA: Diagnosis not present

## 2020-10-02 DIAGNOSIS — M109 Gout, unspecified: Secondary | ICD-10-CM | POA: Diagnosis not present

## 2020-10-02 DIAGNOSIS — Z0000 Encounter for general adult medical examination without abnormal findings: Secondary | ICD-10-CM | POA: Diagnosis not present

## 2020-10-02 DIAGNOSIS — N1831 Chronic kidney disease, stage 3a: Secondary | ICD-10-CM | POA: Diagnosis not present

## 2020-10-02 DIAGNOSIS — Z23 Encounter for immunization: Secondary | ICD-10-CM | POA: Diagnosis not present

## 2020-10-02 DIAGNOSIS — K219 Gastro-esophageal reflux disease without esophagitis: Secondary | ICD-10-CM | POA: Diagnosis not present

## 2020-10-02 DIAGNOSIS — Z1211 Encounter for screening for malignant neoplasm of colon: Secondary | ICD-10-CM | POA: Diagnosis not present

## 2020-10-02 DIAGNOSIS — Z125 Encounter for screening for malignant neoplasm of prostate: Secondary | ICD-10-CM | POA: Diagnosis not present

## 2020-10-02 DIAGNOSIS — Z1389 Encounter for screening for other disorder: Secondary | ICD-10-CM | POA: Diagnosis not present

## 2020-11-27 DIAGNOSIS — N183 Chronic kidney disease, stage 3 unspecified: Secondary | ICD-10-CM | POA: Diagnosis not present

## 2020-11-27 DIAGNOSIS — E782 Mixed hyperlipidemia: Secondary | ICD-10-CM | POA: Diagnosis not present

## 2020-11-27 DIAGNOSIS — I10 Essential (primary) hypertension: Secondary | ICD-10-CM | POA: Diagnosis not present

## 2020-11-27 DIAGNOSIS — K219 Gastro-esophageal reflux disease without esophagitis: Secondary | ICD-10-CM | POA: Diagnosis not present

## 2020-11-27 DIAGNOSIS — G4700 Insomnia, unspecified: Secondary | ICD-10-CM | POA: Diagnosis not present

## 2020-11-27 DIAGNOSIS — N1831 Chronic kidney disease, stage 3a: Secondary | ICD-10-CM | POA: Diagnosis not present

## 2020-11-27 DIAGNOSIS — N400 Enlarged prostate without lower urinary tract symptoms: Secondary | ICD-10-CM | POA: Diagnosis not present

## 2021-02-09 DIAGNOSIS — Z4422 Encounter for fitting and adjustment of artificial left eye: Secondary | ICD-10-CM | POA: Diagnosis not present

## 2021-04-03 DIAGNOSIS — I10 Essential (primary) hypertension: Secondary | ICD-10-CM | POA: Diagnosis not present

## 2021-04-03 DIAGNOSIS — K219 Gastro-esophageal reflux disease without esophagitis: Secondary | ICD-10-CM | POA: Diagnosis not present

## 2021-04-03 DIAGNOSIS — N529 Male erectile dysfunction, unspecified: Secondary | ICD-10-CM | POA: Diagnosis not present

## 2021-04-03 DIAGNOSIS — R7303 Prediabetes: Secondary | ICD-10-CM | POA: Diagnosis not present

## 2021-04-03 DIAGNOSIS — M109 Gout, unspecified: Secondary | ICD-10-CM | POA: Diagnosis not present

## 2021-04-03 DIAGNOSIS — E782 Mixed hyperlipidemia: Secondary | ICD-10-CM | POA: Diagnosis not present

## 2021-04-03 DIAGNOSIS — N1831 Chronic kidney disease, stage 3a: Secondary | ICD-10-CM | POA: Diagnosis not present

## 2021-05-08 DIAGNOSIS — H31091 Other chorioretinal scars, right eye: Secondary | ICD-10-CM | POA: Diagnosis not present

## 2021-05-08 DIAGNOSIS — H2511 Age-related nuclear cataract, right eye: Secondary | ICD-10-CM | POA: Diagnosis not present

## 2021-05-08 DIAGNOSIS — H10413 Chronic giant papillary conjunctivitis, bilateral: Secondary | ICD-10-CM | POA: Diagnosis not present

## 2021-05-29 DIAGNOSIS — H02125 Mechanical ectropion of left lower eyelid: Secondary | ICD-10-CM | POA: Diagnosis not present

## 2021-05-29 DIAGNOSIS — H10412 Chronic giant papillary conjunctivitis, left eye: Secondary | ICD-10-CM | POA: Diagnosis not present

## 2021-05-29 DIAGNOSIS — H57813 Brow ptosis, bilateral: Secondary | ICD-10-CM | POA: Diagnosis not present

## 2021-05-29 DIAGNOSIS — Q111 Other anophthalmos: Secondary | ICD-10-CM | POA: Diagnosis not present

## 2021-05-29 DIAGNOSIS — H05222 Edema of left orbit: Secondary | ICD-10-CM | POA: Diagnosis not present

## 2021-06-05 DIAGNOSIS — H10412 Chronic giant papillary conjunctivitis, left eye: Secondary | ICD-10-CM | POA: Diagnosis not present

## 2021-06-05 DIAGNOSIS — H05222 Edema of left orbit: Secondary | ICD-10-CM | POA: Diagnosis not present

## 2021-06-05 DIAGNOSIS — Q111 Other anophthalmos: Secondary | ICD-10-CM | POA: Diagnosis not present

## 2021-06-05 DIAGNOSIS — H57813 Brow ptosis, bilateral: Secondary | ICD-10-CM | POA: Diagnosis not present

## 2021-06-05 DIAGNOSIS — H02125 Mechanical ectropion of left lower eyelid: Secondary | ICD-10-CM | POA: Diagnosis not present

## 2021-09-04 DIAGNOSIS — H10412 Chronic giant papillary conjunctivitis, left eye: Secondary | ICD-10-CM | POA: Diagnosis not present

## 2021-09-04 DIAGNOSIS — H05332 Deformity of left orbit due to trauma or surgery: Secondary | ICD-10-CM | POA: Diagnosis not present

## 2021-09-04 DIAGNOSIS — H0552 Retained (old) foreign body following penetrating wound of left orbit: Secondary | ICD-10-CM | POA: Diagnosis not present

## 2021-09-04 DIAGNOSIS — Q111 Other anophthalmos: Secondary | ICD-10-CM | POA: Diagnosis not present

## 2021-09-04 DIAGNOSIS — H05222 Edema of left orbit: Secondary | ICD-10-CM | POA: Diagnosis not present

## 2021-09-04 DIAGNOSIS — H02125 Mechanical ectropion of left lower eyelid: Secondary | ICD-10-CM | POA: Diagnosis not present

## 2021-09-04 DIAGNOSIS — H11242 Scarring of conjunctiva, left eye: Secondary | ICD-10-CM | POA: Diagnosis not present

## 2021-09-04 DIAGNOSIS — H57813 Brow ptosis, bilateral: Secondary | ICD-10-CM | POA: Diagnosis not present

## 2021-09-14 DIAGNOSIS — Z4422 Encounter for fitting and adjustment of artificial left eye: Secondary | ICD-10-CM | POA: Diagnosis not present

## 2021-09-28 DIAGNOSIS — Z4422 Encounter for fitting and adjustment of artificial left eye: Secondary | ICD-10-CM | POA: Diagnosis not present

## 2021-11-05 DIAGNOSIS — M109 Gout, unspecified: Secondary | ICD-10-CM | POA: Diagnosis not present

## 2021-11-05 DIAGNOSIS — K219 Gastro-esophageal reflux disease without esophagitis: Secondary | ICD-10-CM | POA: Diagnosis not present

## 2021-11-05 DIAGNOSIS — I10 Essential (primary) hypertension: Secondary | ICD-10-CM | POA: Diagnosis not present

## 2021-11-05 DIAGNOSIS — Z1211 Encounter for screening for malignant neoplasm of colon: Secondary | ICD-10-CM | POA: Diagnosis not present

## 2021-11-05 DIAGNOSIS — Z0000 Encounter for general adult medical examination without abnormal findings: Secondary | ICD-10-CM | POA: Diagnosis not present

## 2021-11-05 DIAGNOSIS — R7303 Prediabetes: Secondary | ICD-10-CM | POA: Diagnosis not present

## 2021-11-05 DIAGNOSIS — E782 Mixed hyperlipidemia: Secondary | ICD-10-CM | POA: Diagnosis not present

## 2021-11-05 DIAGNOSIS — N1831 Chronic kidney disease, stage 3a: Secondary | ICD-10-CM | POA: Diagnosis not present

## 2021-11-05 DIAGNOSIS — N529 Male erectile dysfunction, unspecified: Secondary | ICD-10-CM | POA: Diagnosis not present

## 2021-11-06 DIAGNOSIS — H90A22 Sensorineural hearing loss, unilateral, left ear, with restricted hearing on the contralateral side: Secondary | ICD-10-CM | POA: Diagnosis not present

## 2021-11-06 DIAGNOSIS — H90A31 Mixed conductive and sensorineural hearing loss, unilateral, right ear with restricted hearing on the contralateral side: Secondary | ICD-10-CM | POA: Diagnosis not present

## 2021-11-09 DIAGNOSIS — H6123 Impacted cerumen, bilateral: Secondary | ICD-10-CM | POA: Diagnosis not present

## 2021-11-09 DIAGNOSIS — H90A31 Mixed conductive and sensorineural hearing loss, unilateral, right ear with restricted hearing on the contralateral side: Secondary | ICD-10-CM | POA: Insufficient documentation

## 2021-11-09 DIAGNOSIS — H7111 Cholesteatoma of tympanum, right ear: Secondary | ICD-10-CM | POA: Insufficient documentation

## 2021-12-11 DIAGNOSIS — H16212 Exposure keratoconjunctivitis, left eye: Secondary | ICD-10-CM | POA: Diagnosis not present

## 2021-12-11 DIAGNOSIS — H04562 Stenosis of left lacrimal punctum: Secondary | ICD-10-CM | POA: Diagnosis not present

## 2021-12-11 DIAGNOSIS — H11822 Conjunctivochalasis, left eye: Secondary | ICD-10-CM | POA: Diagnosis not present

## 2021-12-11 DIAGNOSIS — H05332 Deformity of left orbit due to trauma or surgery: Secondary | ICD-10-CM | POA: Diagnosis not present

## 2021-12-11 DIAGNOSIS — H11242 Scarring of conjunctiva, left eye: Secondary | ICD-10-CM | POA: Diagnosis not present

## 2021-12-11 DIAGNOSIS — H02535 Eyelid retraction left lower eyelid: Secondary | ICD-10-CM | POA: Diagnosis not present

## 2021-12-11 DIAGNOSIS — H02115 Cicatricial ectropion of left lower eyelid: Secondary | ICD-10-CM | POA: Diagnosis not present

## 2021-12-11 DIAGNOSIS — H02135 Senile ectropion of left lower eyelid: Secondary | ICD-10-CM | POA: Diagnosis not present

## 2022-02-04 DIAGNOSIS — H11242 Scarring of conjunctiva, left eye: Secondary | ICD-10-CM | POA: Diagnosis not present

## 2022-02-04 DIAGNOSIS — Z09 Encounter for follow-up examination after completed treatment for conditions other than malignant neoplasm: Secondary | ICD-10-CM | POA: Diagnosis not present

## 2022-02-04 DIAGNOSIS — H05332 Deformity of left orbit due to trauma or surgery: Secondary | ICD-10-CM | POA: Diagnosis not present

## 2022-02-04 DIAGNOSIS — H02413 Mechanical ptosis of bilateral eyelids: Secondary | ICD-10-CM | POA: Diagnosis not present

## 2022-02-04 DIAGNOSIS — H02423 Myogenic ptosis of bilateral eyelids: Secondary | ICD-10-CM | POA: Diagnosis not present

## 2022-02-19 DIAGNOSIS — H53481 Generalized contraction of visual field, right eye: Secondary | ICD-10-CM | POA: Diagnosis not present

## 2022-04-11 DIAGNOSIS — M1711 Unilateral primary osteoarthritis, right knee: Secondary | ICD-10-CM | POA: Diagnosis not present

## 2022-05-07 DIAGNOSIS — H02423 Myogenic ptosis of bilateral eyelids: Secondary | ICD-10-CM | POA: Diagnosis not present

## 2022-05-07 DIAGNOSIS — H57813 Brow ptosis, bilateral: Secondary | ICD-10-CM | POA: Diagnosis not present

## 2022-05-07 DIAGNOSIS — H53453 Other localized visual field defect, bilateral: Secondary | ICD-10-CM | POA: Diagnosis not present

## 2022-05-07 DIAGNOSIS — Q111 Other anophthalmos: Secondary | ICD-10-CM | POA: Diagnosis not present

## 2022-05-07 DIAGNOSIS — H02834 Dermatochalasis of left upper eyelid: Secondary | ICD-10-CM | POA: Diagnosis not present

## 2022-05-07 DIAGNOSIS — H02413 Mechanical ptosis of bilateral eyelids: Secondary | ICD-10-CM | POA: Diagnosis not present

## 2022-05-07 DIAGNOSIS — H02831 Dermatochalasis of right upper eyelid: Secondary | ICD-10-CM | POA: Diagnosis not present

## 2022-05-07 DIAGNOSIS — H11242 Scarring of conjunctiva, left eye: Secondary | ICD-10-CM | POA: Diagnosis not present

## 2022-05-07 DIAGNOSIS — H05332 Deformity of left orbit due to trauma or surgery: Secondary | ICD-10-CM | POA: Diagnosis not present

## 2022-09-04 DIAGNOSIS — M1711 Unilateral primary osteoarthritis, right knee: Secondary | ICD-10-CM | POA: Diagnosis not present

## 2022-09-16 DIAGNOSIS — N529 Male erectile dysfunction, unspecified: Secondary | ICD-10-CM | POA: Diagnosis not present

## 2022-09-16 DIAGNOSIS — R7303 Prediabetes: Secondary | ICD-10-CM | POA: Diagnosis not present

## 2022-09-16 DIAGNOSIS — I10 Essential (primary) hypertension: Secondary | ICD-10-CM | POA: Diagnosis not present

## 2022-09-16 DIAGNOSIS — E782 Mixed hyperlipidemia: Secondary | ICD-10-CM | POA: Diagnosis not present

## 2022-09-16 DIAGNOSIS — M109 Gout, unspecified: Secondary | ICD-10-CM | POA: Diagnosis not present

## 2022-09-16 DIAGNOSIS — N1831 Chronic kidney disease, stage 3a: Secondary | ICD-10-CM | POA: Diagnosis not present

## 2022-09-16 DIAGNOSIS — K219 Gastro-esophageal reflux disease without esophagitis: Secondary | ICD-10-CM | POA: Diagnosis not present

## 2022-09-16 DIAGNOSIS — Z23 Encounter for immunization: Secondary | ICD-10-CM | POA: Diagnosis not present

## 2022-10-10 DIAGNOSIS — M1711 Unilateral primary osteoarthritis, right knee: Secondary | ICD-10-CM | POA: Diagnosis not present

## 2022-11-14 DIAGNOSIS — Q111 Other anophthalmos: Secondary | ICD-10-CM | POA: Diagnosis not present

## 2022-11-14 DIAGNOSIS — H11242 Scarring of conjunctiva, left eye: Secondary | ICD-10-CM | POA: Diagnosis not present

## 2022-12-20 DIAGNOSIS — E782 Mixed hyperlipidemia: Secondary | ICD-10-CM | POA: Diagnosis not present

## 2022-12-20 DIAGNOSIS — Z0000 Encounter for general adult medical examination without abnormal findings: Secondary | ICD-10-CM | POA: Diagnosis not present

## 2022-12-20 DIAGNOSIS — N1831 Chronic kidney disease, stage 3a: Secondary | ICD-10-CM | POA: Diagnosis not present

## 2022-12-20 DIAGNOSIS — N529 Male erectile dysfunction, unspecified: Secondary | ICD-10-CM | POA: Diagnosis not present

## 2022-12-20 DIAGNOSIS — I10 Essential (primary) hypertension: Secondary | ICD-10-CM | POA: Diagnosis not present

## 2022-12-20 DIAGNOSIS — R7303 Prediabetes: Secondary | ICD-10-CM | POA: Diagnosis not present

## 2022-12-20 DIAGNOSIS — M1711 Unilateral primary osteoarthritis, right knee: Secondary | ICD-10-CM | POA: Diagnosis not present

## 2022-12-20 DIAGNOSIS — M109 Gout, unspecified: Secondary | ICD-10-CM | POA: Diagnosis not present

## 2022-12-20 DIAGNOSIS — K219 Gastro-esophageal reflux disease without esophagitis: Secondary | ICD-10-CM | POA: Diagnosis not present

## 2022-12-20 DIAGNOSIS — Z1331 Encounter for screening for depression: Secondary | ICD-10-CM | POA: Diagnosis not present

## 2022-12-26 DIAGNOSIS — Z1211 Encounter for screening for malignant neoplasm of colon: Secondary | ICD-10-CM | POA: Diagnosis not present

## 2022-12-26 DIAGNOSIS — Z23 Encounter for immunization: Secondary | ICD-10-CM | POA: Diagnosis not present

## 2023-02-11 DIAGNOSIS — M25561 Pain in right knee: Secondary | ICD-10-CM | POA: Diagnosis not present

## 2023-02-11 DIAGNOSIS — G8929 Other chronic pain: Secondary | ICD-10-CM | POA: Diagnosis not present

## 2023-02-11 DIAGNOSIS — I10 Essential (primary) hypertension: Secondary | ICD-10-CM | POA: Diagnosis not present

## 2023-02-21 DIAGNOSIS — M25561 Pain in right knee: Secondary | ICD-10-CM | POA: Diagnosis not present

## 2023-02-21 DIAGNOSIS — G8929 Other chronic pain: Secondary | ICD-10-CM | POA: Diagnosis not present

## 2023-02-26 DIAGNOSIS — G8918 Other acute postprocedural pain: Secondary | ICD-10-CM | POA: Diagnosis not present

## 2023-02-26 DIAGNOSIS — M1711 Unilateral primary osteoarthritis, right knee: Secondary | ICD-10-CM | POA: Diagnosis not present

## 2023-03-06 DIAGNOSIS — M25561 Pain in right knee: Secondary | ICD-10-CM | POA: Diagnosis not present

## 2023-03-06 DIAGNOSIS — M25461 Effusion, right knee: Secondary | ICD-10-CM | POA: Diagnosis not present

## 2023-03-06 DIAGNOSIS — Z96651 Presence of right artificial knee joint: Secondary | ICD-10-CM | POA: Diagnosis not present

## 2023-03-06 DIAGNOSIS — M25661 Stiffness of right knee, not elsewhere classified: Secondary | ICD-10-CM | POA: Diagnosis not present

## 2023-03-06 DIAGNOSIS — R29898 Other symptoms and signs involving the musculoskeletal system: Secondary | ICD-10-CM | POA: Diagnosis not present

## 2023-03-06 DIAGNOSIS — Z7409 Other reduced mobility: Secondary | ICD-10-CM | POA: Diagnosis not present

## 2023-03-06 DIAGNOSIS — G8929 Other chronic pain: Secondary | ICD-10-CM | POA: Diagnosis not present

## 2023-03-11 DIAGNOSIS — R29898 Other symptoms and signs involving the musculoskeletal system: Secondary | ICD-10-CM | POA: Diagnosis not present

## 2023-03-11 DIAGNOSIS — M25461 Effusion, right knee: Secondary | ICD-10-CM | POA: Diagnosis not present

## 2023-03-11 DIAGNOSIS — M25661 Stiffness of right knee, not elsewhere classified: Secondary | ICD-10-CM | POA: Diagnosis not present

## 2023-03-11 DIAGNOSIS — Z96651 Presence of right artificial knee joint: Secondary | ICD-10-CM | POA: Diagnosis not present

## 2023-03-11 DIAGNOSIS — M25561 Pain in right knee: Secondary | ICD-10-CM | POA: Diagnosis not present

## 2023-03-13 DIAGNOSIS — Z96651 Presence of right artificial knee joint: Secondary | ICD-10-CM | POA: Diagnosis not present

## 2023-03-13 DIAGNOSIS — R29898 Other symptoms and signs involving the musculoskeletal system: Secondary | ICD-10-CM | POA: Diagnosis not present

## 2023-03-13 DIAGNOSIS — M25461 Effusion, right knee: Secondary | ICD-10-CM | POA: Diagnosis not present

## 2023-03-13 DIAGNOSIS — M25661 Stiffness of right knee, not elsewhere classified: Secondary | ICD-10-CM | POA: Diagnosis not present

## 2023-03-13 DIAGNOSIS — M25561 Pain in right knee: Secondary | ICD-10-CM | POA: Diagnosis not present

## 2023-03-18 DIAGNOSIS — M25661 Stiffness of right knee, not elsewhere classified: Secondary | ICD-10-CM | POA: Diagnosis not present

## 2023-03-18 DIAGNOSIS — R29898 Other symptoms and signs involving the musculoskeletal system: Secondary | ICD-10-CM | POA: Diagnosis not present

## 2023-03-18 DIAGNOSIS — M25461 Effusion, right knee: Secondary | ICD-10-CM | POA: Diagnosis not present

## 2023-03-18 DIAGNOSIS — Z96651 Presence of right artificial knee joint: Secondary | ICD-10-CM | POA: Diagnosis not present

## 2023-03-18 DIAGNOSIS — M25561 Pain in right knee: Secondary | ICD-10-CM | POA: Diagnosis not present

## 2023-03-19 DIAGNOSIS — M25661 Stiffness of right knee, not elsewhere classified: Secondary | ICD-10-CM | POA: Diagnosis not present

## 2023-03-19 DIAGNOSIS — R29898 Other symptoms and signs involving the musculoskeletal system: Secondary | ICD-10-CM | POA: Diagnosis not present

## 2023-03-19 DIAGNOSIS — M25561 Pain in right knee: Secondary | ICD-10-CM | POA: Diagnosis not present

## 2023-03-19 DIAGNOSIS — M25461 Effusion, right knee: Secondary | ICD-10-CM | POA: Diagnosis not present

## 2023-03-19 DIAGNOSIS — Z96651 Presence of right artificial knee joint: Secondary | ICD-10-CM | POA: Diagnosis not present

## 2023-03-25 DIAGNOSIS — M25661 Stiffness of right knee, not elsewhere classified: Secondary | ICD-10-CM | POA: Diagnosis not present

## 2023-03-25 DIAGNOSIS — R29898 Other symptoms and signs involving the musculoskeletal system: Secondary | ICD-10-CM | POA: Diagnosis not present

## 2023-03-25 DIAGNOSIS — Z7409 Other reduced mobility: Secondary | ICD-10-CM | POA: Diagnosis not present

## 2023-03-25 DIAGNOSIS — M25461 Effusion, right knee: Secondary | ICD-10-CM | POA: Diagnosis not present

## 2023-03-25 DIAGNOSIS — Z96651 Presence of right artificial knee joint: Secondary | ICD-10-CM | POA: Diagnosis not present

## 2023-03-25 DIAGNOSIS — M25561 Pain in right knee: Secondary | ICD-10-CM | POA: Diagnosis not present

## 2023-03-27 DIAGNOSIS — M25561 Pain in right knee: Secondary | ICD-10-CM | POA: Diagnosis not present

## 2023-03-27 DIAGNOSIS — Z7409 Other reduced mobility: Secondary | ICD-10-CM | POA: Diagnosis not present

## 2023-03-27 DIAGNOSIS — Z96651 Presence of right artificial knee joint: Secondary | ICD-10-CM | POA: Diagnosis not present

## 2023-03-27 DIAGNOSIS — R29898 Other symptoms and signs involving the musculoskeletal system: Secondary | ICD-10-CM | POA: Diagnosis not present

## 2023-03-27 DIAGNOSIS — M25461 Effusion, right knee: Secondary | ICD-10-CM | POA: Diagnosis not present

## 2023-03-27 DIAGNOSIS — M25661 Stiffness of right knee, not elsewhere classified: Secondary | ICD-10-CM | POA: Diagnosis not present

## 2023-04-01 DIAGNOSIS — R29898 Other symptoms and signs involving the musculoskeletal system: Secondary | ICD-10-CM | POA: Diagnosis not present

## 2023-04-01 DIAGNOSIS — Z7409 Other reduced mobility: Secondary | ICD-10-CM | POA: Diagnosis not present

## 2023-04-01 DIAGNOSIS — M25661 Stiffness of right knee, not elsewhere classified: Secondary | ICD-10-CM | POA: Diagnosis not present

## 2023-04-01 DIAGNOSIS — M25461 Effusion, right knee: Secondary | ICD-10-CM | POA: Diagnosis not present

## 2023-04-01 DIAGNOSIS — M25561 Pain in right knee: Secondary | ICD-10-CM | POA: Diagnosis not present

## 2023-04-01 DIAGNOSIS — Z96651 Presence of right artificial knee joint: Secondary | ICD-10-CM | POA: Diagnosis not present

## 2023-04-04 DIAGNOSIS — M25661 Stiffness of right knee, not elsewhere classified: Secondary | ICD-10-CM | POA: Diagnosis not present

## 2023-04-04 DIAGNOSIS — Z7409 Other reduced mobility: Secondary | ICD-10-CM | POA: Diagnosis not present

## 2023-04-04 DIAGNOSIS — R29898 Other symptoms and signs involving the musculoskeletal system: Secondary | ICD-10-CM | POA: Diagnosis not present

## 2023-04-04 DIAGNOSIS — M25561 Pain in right knee: Secondary | ICD-10-CM | POA: Diagnosis not present

## 2023-04-04 DIAGNOSIS — M25461 Effusion, right knee: Secondary | ICD-10-CM | POA: Diagnosis not present

## 2023-04-04 DIAGNOSIS — Z96651 Presence of right artificial knee joint: Secondary | ICD-10-CM | POA: Diagnosis not present

## 2023-04-08 DIAGNOSIS — M25461 Effusion, right knee: Secondary | ICD-10-CM | POA: Diagnosis not present

## 2023-04-08 DIAGNOSIS — G8929 Other chronic pain: Secondary | ICD-10-CM | POA: Diagnosis not present

## 2023-04-08 DIAGNOSIS — M25661 Stiffness of right knee, not elsewhere classified: Secondary | ICD-10-CM | POA: Diagnosis not present

## 2023-04-08 DIAGNOSIS — M25561 Pain in right knee: Secondary | ICD-10-CM | POA: Diagnosis not present

## 2023-04-08 DIAGNOSIS — Z96651 Presence of right artificial knee joint: Secondary | ICD-10-CM | POA: Diagnosis not present

## 2023-04-08 DIAGNOSIS — Z7409 Other reduced mobility: Secondary | ICD-10-CM | POA: Diagnosis not present

## 2023-04-08 DIAGNOSIS — R29898 Other symptoms and signs involving the musculoskeletal system: Secondary | ICD-10-CM | POA: Diagnosis not present

## 2023-04-11 DIAGNOSIS — Z96651 Presence of right artificial knee joint: Secondary | ICD-10-CM | POA: Diagnosis not present

## 2023-04-11 DIAGNOSIS — Z7409 Other reduced mobility: Secondary | ICD-10-CM | POA: Diagnosis not present

## 2023-04-11 DIAGNOSIS — M25661 Stiffness of right knee, not elsewhere classified: Secondary | ICD-10-CM | POA: Diagnosis not present

## 2023-04-11 DIAGNOSIS — M25461 Effusion, right knee: Secondary | ICD-10-CM | POA: Diagnosis not present

## 2023-04-11 DIAGNOSIS — R29898 Other symptoms and signs involving the musculoskeletal system: Secondary | ICD-10-CM | POA: Diagnosis not present

## 2023-04-11 DIAGNOSIS — M25561 Pain in right knee: Secondary | ICD-10-CM | POA: Diagnosis not present

## 2023-04-15 DIAGNOSIS — Z96651 Presence of right artificial knee joint: Secondary | ICD-10-CM | POA: Diagnosis not present

## 2023-04-15 DIAGNOSIS — M25461 Effusion, right knee: Secondary | ICD-10-CM | POA: Diagnosis not present

## 2023-04-15 DIAGNOSIS — G8929 Other chronic pain: Secondary | ICD-10-CM | POA: Diagnosis not present

## 2023-04-15 DIAGNOSIS — Z7409 Other reduced mobility: Secondary | ICD-10-CM | POA: Diagnosis not present

## 2023-04-15 DIAGNOSIS — M25561 Pain in right knee: Secondary | ICD-10-CM | POA: Diagnosis not present

## 2023-04-15 DIAGNOSIS — R29898 Other symptoms and signs involving the musculoskeletal system: Secondary | ICD-10-CM | POA: Diagnosis not present

## 2023-04-15 DIAGNOSIS — M25661 Stiffness of right knee, not elsewhere classified: Secondary | ICD-10-CM | POA: Diagnosis not present

## 2023-04-18 DIAGNOSIS — G8929 Other chronic pain: Secondary | ICD-10-CM | POA: Diagnosis not present

## 2023-04-18 DIAGNOSIS — M25661 Stiffness of right knee, not elsewhere classified: Secondary | ICD-10-CM | POA: Diagnosis not present

## 2023-04-18 DIAGNOSIS — Z7409 Other reduced mobility: Secondary | ICD-10-CM | POA: Diagnosis not present

## 2023-04-18 DIAGNOSIS — R29898 Other symptoms and signs involving the musculoskeletal system: Secondary | ICD-10-CM | POA: Diagnosis not present

## 2023-04-18 DIAGNOSIS — M25461 Effusion, right knee: Secondary | ICD-10-CM | POA: Diagnosis not present

## 2023-04-18 DIAGNOSIS — M25561 Pain in right knee: Secondary | ICD-10-CM | POA: Diagnosis not present

## 2023-04-18 DIAGNOSIS — Z96651 Presence of right artificial knee joint: Secondary | ICD-10-CM | POA: Diagnosis not present

## 2023-04-22 DIAGNOSIS — M25661 Stiffness of right knee, not elsewhere classified: Secondary | ICD-10-CM | POA: Diagnosis not present

## 2023-04-22 DIAGNOSIS — Z96651 Presence of right artificial knee joint: Secondary | ICD-10-CM | POA: Diagnosis not present

## 2023-04-22 DIAGNOSIS — M25561 Pain in right knee: Secondary | ICD-10-CM | POA: Diagnosis not present

## 2023-04-22 DIAGNOSIS — R29898 Other symptoms and signs involving the musculoskeletal system: Secondary | ICD-10-CM | POA: Diagnosis not present

## 2023-04-22 DIAGNOSIS — Z7409 Other reduced mobility: Secondary | ICD-10-CM | POA: Diagnosis not present

## 2023-04-22 DIAGNOSIS — M25461 Effusion, right knee: Secondary | ICD-10-CM | POA: Diagnosis not present

## 2023-04-25 DIAGNOSIS — Z96651 Presence of right artificial knee joint: Secondary | ICD-10-CM | POA: Diagnosis not present

## 2023-04-25 DIAGNOSIS — Z7409 Other reduced mobility: Secondary | ICD-10-CM | POA: Diagnosis not present

## 2023-04-25 DIAGNOSIS — M25661 Stiffness of right knee, not elsewhere classified: Secondary | ICD-10-CM | POA: Diagnosis not present

## 2023-04-25 DIAGNOSIS — R29898 Other symptoms and signs involving the musculoskeletal system: Secondary | ICD-10-CM | POA: Diagnosis not present

## 2023-04-25 DIAGNOSIS — M25461 Effusion, right knee: Secondary | ICD-10-CM | POA: Diagnosis not present

## 2023-04-25 DIAGNOSIS — M25561 Pain in right knee: Secondary | ICD-10-CM | POA: Diagnosis not present

## 2023-04-29 DIAGNOSIS — M25661 Stiffness of right knee, not elsewhere classified: Secondary | ICD-10-CM | POA: Diagnosis not present

## 2023-04-29 DIAGNOSIS — M25461 Effusion, right knee: Secondary | ICD-10-CM | POA: Diagnosis not present

## 2023-04-29 DIAGNOSIS — Z7409 Other reduced mobility: Secondary | ICD-10-CM | POA: Diagnosis not present

## 2023-04-29 DIAGNOSIS — M25561 Pain in right knee: Secondary | ICD-10-CM | POA: Diagnosis not present

## 2023-04-29 DIAGNOSIS — Z96651 Presence of right artificial knee joint: Secondary | ICD-10-CM | POA: Diagnosis not present

## 2023-04-29 DIAGNOSIS — R29898 Other symptoms and signs involving the musculoskeletal system: Secondary | ICD-10-CM | POA: Diagnosis not present

## 2023-05-02 DIAGNOSIS — M25461 Effusion, right knee: Secondary | ICD-10-CM | POA: Diagnosis not present

## 2023-05-02 DIAGNOSIS — Z96651 Presence of right artificial knee joint: Secondary | ICD-10-CM | POA: Diagnosis not present

## 2023-05-02 DIAGNOSIS — M25661 Stiffness of right knee, not elsewhere classified: Secondary | ICD-10-CM | POA: Diagnosis not present

## 2023-05-02 DIAGNOSIS — Z7409 Other reduced mobility: Secondary | ICD-10-CM | POA: Diagnosis not present

## 2023-05-02 DIAGNOSIS — R29898 Other symptoms and signs involving the musculoskeletal system: Secondary | ICD-10-CM | POA: Diagnosis not present

## 2023-05-02 DIAGNOSIS — M25561 Pain in right knee: Secondary | ICD-10-CM | POA: Diagnosis not present

## 2023-05-05 ENCOUNTER — Ambulatory Visit: Payer: Medicare HMO | Admitting: Podiatry

## 2023-05-05 ENCOUNTER — Encounter: Payer: Self-pay | Admitting: Podiatry

## 2023-05-05 DIAGNOSIS — Z96651 Presence of right artificial knee joint: Secondary | ICD-10-CM | POA: Diagnosis not present

## 2023-05-05 DIAGNOSIS — M25461 Effusion, right knee: Secondary | ICD-10-CM | POA: Diagnosis not present

## 2023-05-05 DIAGNOSIS — M25661 Stiffness of right knee, not elsewhere classified: Secondary | ICD-10-CM | POA: Diagnosis not present

## 2023-05-05 DIAGNOSIS — M21622 Bunionette of left foot: Secondary | ICD-10-CM | POA: Diagnosis not present

## 2023-05-05 DIAGNOSIS — M79674 Pain in right toe(s): Secondary | ICD-10-CM

## 2023-05-05 DIAGNOSIS — B351 Tinea unguium: Secondary | ICD-10-CM | POA: Diagnosis not present

## 2023-05-05 DIAGNOSIS — M25561 Pain in right knee: Secondary | ICD-10-CM | POA: Diagnosis not present

## 2023-05-05 DIAGNOSIS — Q828 Other specified congenital malformations of skin: Secondary | ICD-10-CM

## 2023-05-05 DIAGNOSIS — M79675 Pain in left toe(s): Secondary | ICD-10-CM

## 2023-05-05 DIAGNOSIS — Z7409 Other reduced mobility: Secondary | ICD-10-CM | POA: Diagnosis not present

## 2023-05-05 DIAGNOSIS — R29898 Other symptoms and signs involving the musculoskeletal system: Secondary | ICD-10-CM | POA: Diagnosis not present

## 2023-05-07 DIAGNOSIS — M25661 Stiffness of right knee, not elsewhere classified: Secondary | ICD-10-CM | POA: Diagnosis not present

## 2023-05-07 DIAGNOSIS — R29898 Other symptoms and signs involving the musculoskeletal system: Secondary | ICD-10-CM | POA: Diagnosis not present

## 2023-05-07 DIAGNOSIS — Z7409 Other reduced mobility: Secondary | ICD-10-CM | POA: Diagnosis not present

## 2023-05-07 DIAGNOSIS — M25461 Effusion, right knee: Secondary | ICD-10-CM | POA: Diagnosis not present

## 2023-05-07 DIAGNOSIS — Z96651 Presence of right artificial knee joint: Secondary | ICD-10-CM | POA: Diagnosis not present

## 2023-05-07 DIAGNOSIS — M25561 Pain in right knee: Secondary | ICD-10-CM | POA: Diagnosis not present

## 2023-05-07 NOTE — Progress Notes (Signed)
Several different problem subjective:   Patient ID: Francisco Wells, male   DOB: 82 y.o.   MRN: [id number]   HPI Is with 1 being bone structural issues fifth metatarsal right keratotic lesion subfifth metatarsal bilateral and thick yellow brittle nailbeds of both feet.  Patient does not smoke tries to be active   Review of Systems  All other systems reviewed and are negative.       Objective:  Physical Exam Vitals and nursing note reviewed.  Constitutional:      Appearance: He is well-developed.  Pulmonary:     Effort: Pulmonary effort is normal.  Musculoskeletal:        General: Normal range of motion.  Skin:    General: Skin is warm.  Neurological:     Mental Status: He is alert.     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

## 2023-05-14 DIAGNOSIS — Z7409 Other reduced mobility: Secondary | ICD-10-CM | POA: Diagnosis not present

## 2023-05-14 DIAGNOSIS — R29898 Other symptoms and signs involving the musculoskeletal system: Secondary | ICD-10-CM | POA: Diagnosis not present

## 2023-05-14 DIAGNOSIS — M25461 Effusion, right knee: Secondary | ICD-10-CM | POA: Diagnosis not present

## 2023-05-14 DIAGNOSIS — Z96651 Presence of right artificial knee joint: Secondary | ICD-10-CM | POA: Diagnosis not present

## 2023-05-14 DIAGNOSIS — M25561 Pain in right knee: Secondary | ICD-10-CM | POA: Diagnosis not present

## 2023-05-14 DIAGNOSIS — M25661 Stiffness of right knee, not elsewhere classified: Secondary | ICD-10-CM | POA: Diagnosis not present

## 2023-05-21 DIAGNOSIS — M25661 Stiffness of right knee, not elsewhere classified: Secondary | ICD-10-CM | POA: Diagnosis not present

## 2023-05-21 DIAGNOSIS — R29898 Other symptoms and signs involving the musculoskeletal system: Secondary | ICD-10-CM | POA: Diagnosis not present

## 2023-05-21 DIAGNOSIS — Z96651 Presence of right artificial knee joint: Secondary | ICD-10-CM | POA: Diagnosis not present

## 2023-05-21 DIAGNOSIS — Z7409 Other reduced mobility: Secondary | ICD-10-CM | POA: Diagnosis not present

## 2023-06-23 DIAGNOSIS — R600 Localized edema: Secondary | ICD-10-CM | POA: Diagnosis not present

## 2023-06-23 DIAGNOSIS — M109 Gout, unspecified: Secondary | ICD-10-CM | POA: Diagnosis not present

## 2023-06-23 DIAGNOSIS — I10 Essential (primary) hypertension: Secondary | ICD-10-CM | POA: Diagnosis not present

## 2023-06-23 DIAGNOSIS — K219 Gastro-esophageal reflux disease without esophagitis: Secondary | ICD-10-CM | POA: Diagnosis not present

## 2023-06-23 DIAGNOSIS — N1831 Chronic kidney disease, stage 3a: Secondary | ICD-10-CM | POA: Diagnosis not present

## 2023-06-23 DIAGNOSIS — E782 Mixed hyperlipidemia: Secondary | ICD-10-CM | POA: Diagnosis not present

## 2023-06-23 DIAGNOSIS — N529 Male erectile dysfunction, unspecified: Secondary | ICD-10-CM | POA: Diagnosis not present

## 2023-06-23 DIAGNOSIS — R7303 Prediabetes: Secondary | ICD-10-CM | POA: Diagnosis not present

## 2023-07-08 DIAGNOSIS — H6123 Impacted cerumen, bilateral: Secondary | ICD-10-CM | POA: Diagnosis not present

## 2023-08-14 DIAGNOSIS — Z4422 Encounter for fitting and adjustment of artificial left eye: Secondary | ICD-10-CM | POA: Diagnosis not present

## 2023-11-27 DIAGNOSIS — H524 Presbyopia: Secondary | ICD-10-CM | POA: Diagnosis not present

## 2023-11-27 DIAGNOSIS — Z0100 Encounter for examination of eyes and vision without abnormal findings: Secondary | ICD-10-CM | POA: Diagnosis not present

## 2023-12-10 DIAGNOSIS — H0279 Other degenerative disorders of eyelid and periocular area: Secondary | ICD-10-CM | POA: Diagnosis not present

## 2023-12-10 DIAGNOSIS — H11242 Scarring of conjunctiva, left eye: Secondary | ICD-10-CM | POA: Diagnosis not present

## 2024-01-15 DIAGNOSIS — I10 Essential (primary) hypertension: Secondary | ICD-10-CM | POA: Diagnosis not present

## 2024-01-15 DIAGNOSIS — Z23 Encounter for immunization: Secondary | ICD-10-CM | POA: Diagnosis not present

## 2024-01-15 DIAGNOSIS — N1831 Chronic kidney disease, stage 3a: Secondary | ICD-10-CM | POA: Diagnosis not present

## 2024-01-15 DIAGNOSIS — R600 Localized edema: Secondary | ICD-10-CM | POA: Diagnosis not present

## 2024-01-15 DIAGNOSIS — M109 Gout, unspecified: Secondary | ICD-10-CM | POA: Diagnosis not present

## 2024-01-15 DIAGNOSIS — E782 Mixed hyperlipidemia: Secondary | ICD-10-CM | POA: Diagnosis not present

## 2024-01-15 DIAGNOSIS — R7303 Prediabetes: Secondary | ICD-10-CM | POA: Diagnosis not present

## 2024-01-15 DIAGNOSIS — Z0000 Encounter for general adult medical examination without abnormal findings: Secondary | ICD-10-CM | POA: Diagnosis not present

## 2024-01-15 DIAGNOSIS — K219 Gastro-esophageal reflux disease without esophagitis: Secondary | ICD-10-CM | POA: Diagnosis not present

## 2024-02-09 DIAGNOSIS — Z4422 Encounter for fitting and adjustment of artificial left eye: Secondary | ICD-10-CM | POA: Diagnosis not present

## 2024-02-10 DIAGNOSIS — H903 Sensorineural hearing loss, bilateral: Secondary | ICD-10-CM | POA: Diagnosis not present

## 2024-02-17 DIAGNOSIS — H6123 Impacted cerumen, bilateral: Secondary | ICD-10-CM | POA: Diagnosis not present

## 2024-02-17 DIAGNOSIS — H722X1 Other marginal perforations of tympanic membrane, right ear: Secondary | ICD-10-CM | POA: Diagnosis not present

## 2024-02-17 DIAGNOSIS — H90A31 Mixed conductive and sensorineural hearing loss, unilateral, right ear with restricted hearing on the contralateral side: Secondary | ICD-10-CM | POA: Diagnosis not present

## 2024-04-06 DIAGNOSIS — Z96651 Presence of right artificial knee joint: Secondary | ICD-10-CM | POA: Diagnosis not present

## 2024-05-06 DIAGNOSIS — N1831 Chronic kidney disease, stage 3a: Secondary | ICD-10-CM | POA: Diagnosis not present

## 2024-05-06 DIAGNOSIS — I10 Essential (primary) hypertension: Secondary | ICD-10-CM | POA: Diagnosis not present

## 2024-05-20 DIAGNOSIS — H0100B Unspecified blepharitis left eye, upper and lower eyelids: Secondary | ICD-10-CM | POA: Diagnosis not present

## 2024-05-20 DIAGNOSIS — H0279 Other degenerative disorders of eyelid and periocular area: Secondary | ICD-10-CM | POA: Diagnosis not present

## 2024-05-20 DIAGNOSIS — H1012 Acute atopic conjunctivitis, left eye: Secondary | ICD-10-CM | POA: Diagnosis not present

## 2024-05-20 DIAGNOSIS — Q111 Other anophthalmos: Secondary | ICD-10-CM | POA: Diagnosis not present

## 2024-05-20 DIAGNOSIS — H11242 Scarring of conjunctiva, left eye: Secondary | ICD-10-CM | POA: Diagnosis not present

## 2024-05-31 DIAGNOSIS — E782 Mixed hyperlipidemia: Secondary | ICD-10-CM | POA: Diagnosis not present

## 2024-05-31 DIAGNOSIS — N1831 Chronic kidney disease, stage 3a: Secondary | ICD-10-CM | POA: Diagnosis not present

## 2024-05-31 DIAGNOSIS — I10 Essential (primary) hypertension: Secondary | ICD-10-CM | POA: Diagnosis not present

## 2024-06-04 DIAGNOSIS — I10 Essential (primary) hypertension: Secondary | ICD-10-CM | POA: Diagnosis not present

## 2024-06-04 DIAGNOSIS — N1831 Chronic kidney disease, stage 3a: Secondary | ICD-10-CM | POA: Diagnosis not present

## 2024-07-01 DIAGNOSIS — I10 Essential (primary) hypertension: Secondary | ICD-10-CM | POA: Diagnosis not present

## 2024-07-01 DIAGNOSIS — N1831 Chronic kidney disease, stage 3a: Secondary | ICD-10-CM | POA: Diagnosis not present

## 2024-07-01 DIAGNOSIS — E782 Mixed hyperlipidemia: Secondary | ICD-10-CM | POA: Diagnosis not present

## 2024-07-04 DIAGNOSIS — I10 Essential (primary) hypertension: Secondary | ICD-10-CM | POA: Diagnosis not present

## 2024-07-04 DIAGNOSIS — N1831 Chronic kidney disease, stage 3a: Secondary | ICD-10-CM | POA: Diagnosis not present

## 2024-07-15 DIAGNOSIS — R600 Localized edema: Secondary | ICD-10-CM | POA: Diagnosis not present

## 2024-07-15 DIAGNOSIS — K219 Gastro-esophageal reflux disease without esophagitis: Secondary | ICD-10-CM | POA: Diagnosis not present

## 2024-07-15 DIAGNOSIS — R7303 Prediabetes: Secondary | ICD-10-CM | POA: Diagnosis not present

## 2024-07-15 DIAGNOSIS — N1831 Chronic kidney disease, stage 3a: Secondary | ICD-10-CM | POA: Diagnosis not present

## 2024-07-15 DIAGNOSIS — E782 Mixed hyperlipidemia: Secondary | ICD-10-CM | POA: Diagnosis not present

## 2024-07-15 DIAGNOSIS — M109 Gout, unspecified: Secondary | ICD-10-CM | POA: Diagnosis not present

## 2024-07-15 DIAGNOSIS — I10 Essential (primary) hypertension: Secondary | ICD-10-CM | POA: Diagnosis not present

## 2024-08-01 DIAGNOSIS — I10 Essential (primary) hypertension: Secondary | ICD-10-CM | POA: Diagnosis not present

## 2024-08-01 DIAGNOSIS — N1831 Chronic kidney disease, stage 3a: Secondary | ICD-10-CM | POA: Diagnosis not present

## 2024-08-01 DIAGNOSIS — E782 Mixed hyperlipidemia: Secondary | ICD-10-CM | POA: Diagnosis not present

## 2024-08-03 DIAGNOSIS — I10 Essential (primary) hypertension: Secondary | ICD-10-CM | POA: Diagnosis not present

## 2024-08-03 DIAGNOSIS — N1831 Chronic kidney disease, stage 3a: Secondary | ICD-10-CM | POA: Diagnosis not present

## 2024-08-31 DIAGNOSIS — E782 Mixed hyperlipidemia: Secondary | ICD-10-CM | POA: Diagnosis not present

## 2024-08-31 DIAGNOSIS — N1831 Chronic kidney disease, stage 3a: Secondary | ICD-10-CM | POA: Diagnosis not present

## 2024-08-31 DIAGNOSIS — I10 Essential (primary) hypertension: Secondary | ICD-10-CM | POA: Diagnosis not present
